# Patient Record
Sex: Female | Born: 1940
Health system: Southern US, Community
[De-identification: ages and names within clinical notes are randomized; demographics above are authoritative.]

## PROBLEM LIST (undated history)

## (undated) DIAGNOSIS — I10 Essential (primary) hypertension: Secondary | ICD-10-CM

## (undated) HISTORY — PX: APPENDECTOMY: SHX54

## (undated) HISTORY — PX: ABDOMINAL HYSTERECTOMY: SHX81

## (undated) HISTORY — DX: Essential (primary) hypertension: I10

---

## 2001-06-12 ENCOUNTER — Ambulatory Visit (HOSPITAL_COMMUNITY): Admission: RE | Admit: 2001-06-12 | Discharge: 2001-06-12 | Payer: Self-pay | Admitting: Obstetrics and Gynecology

## 2001-06-12 ENCOUNTER — Encounter: Payer: Self-pay | Admitting: Obstetrics and Gynecology

## 2001-06-21 ENCOUNTER — Other Ambulatory Visit: Admission: RE | Admit: 2001-06-21 | Discharge: 2001-06-21 | Payer: Self-pay | Admitting: Dermatology

## 2002-04-30 ENCOUNTER — Encounter: Payer: Self-pay | Admitting: Obstetrics and Gynecology

## 2002-04-30 ENCOUNTER — Ambulatory Visit (HOSPITAL_COMMUNITY): Admission: RE | Admit: 2002-04-30 | Discharge: 2002-04-30 | Payer: Self-pay | Admitting: Obstetrics and Gynecology

## 2004-05-31 ENCOUNTER — Ambulatory Visit (HOSPITAL_COMMUNITY): Admission: RE | Admit: 2004-05-31 | Discharge: 2004-05-31 | Payer: Self-pay | Admitting: Obstetrics and Gynecology

## 2005-09-06 ENCOUNTER — Ambulatory Visit (HOSPITAL_COMMUNITY): Admission: RE | Admit: 2005-09-06 | Discharge: 2005-09-06 | Payer: Self-pay | Admitting: Obstetrics and Gynecology

## 2006-05-31 ENCOUNTER — Ambulatory Visit: Payer: Self-pay | Admitting: Internal Medicine

## 2006-06-08 ENCOUNTER — Ambulatory Visit: Payer: Self-pay | Admitting: Internal Medicine

## 2006-06-08 ENCOUNTER — Ambulatory Visit (HOSPITAL_COMMUNITY): Admission: RE | Admit: 2006-06-08 | Discharge: 2006-06-08 | Payer: Self-pay | Admitting: Internal Medicine

## 2006-12-18 ENCOUNTER — Ambulatory Visit: Payer: Self-pay | Admitting: Family Medicine

## 2006-12-18 DIAGNOSIS — M129 Arthropathy, unspecified: Secondary | ICD-10-CM | POA: Insufficient documentation

## 2006-12-18 DIAGNOSIS — K648 Other hemorrhoids: Secondary | ICD-10-CM | POA: Insufficient documentation

## 2006-12-19 ENCOUNTER — Encounter (INDEPENDENT_AMBULATORY_CARE_PROVIDER_SITE_OTHER): Payer: Self-pay | Admitting: Family Medicine

## 2006-12-20 ENCOUNTER — Telehealth (INDEPENDENT_AMBULATORY_CARE_PROVIDER_SITE_OTHER): Payer: Self-pay | Admitting: *Deleted

## 2006-12-20 LAB — CONVERTED CEMR LAB
Albumin: 4.3 g/dL (ref 3.5–5.2)
Alkaline Phosphatase: 75 units/L (ref 39–117)
BUN: 18 mg/dL (ref 6–23)
CO2: 24 meq/L (ref 19–32)
Cholesterol: 251 mg/dL — ABNORMAL HIGH (ref 0–200)
Eosinophils Absolute: 0.1 10*3/uL (ref 0.0–0.7)
Eosinophils Relative: 1 % (ref 0–5)
Glucose, Bld: 93 mg/dL (ref 70–99)
HCT: 42 % (ref 36.0–46.0)
HDL: 59 mg/dL (ref >39)
Hemoglobin: 13.2 g/dL (ref 12.0–15.0)
LDL Cholesterol: 167 mg/dL — ABNORMAL HIGH (ref 0–99)
Lymphocytes Relative: 40 % (ref 12–46)
Lymphs Abs: 3.2 10*3/uL (ref 0.7–3.3)
MCV: 98.1 fL (ref 78.0–100.0)
Monocytes Relative: 7 % (ref 3–11)
RBC: 4.28 M/uL (ref 3.87–5.11)
Sodium: 143 meq/L (ref 135–145)
Total Bilirubin: 0.4 mg/dL (ref 0.3–1.2)
Total Protein: 7 g/dL (ref 6.0–8.3)
Triglycerides: 127 mg/dL (ref <150)
VLDL: 25 mg/dL (ref 0–40)
WBC: 8.1 10*3/uL (ref 4.0–10.5)

## 2006-12-26 ENCOUNTER — Encounter (INDEPENDENT_AMBULATORY_CARE_PROVIDER_SITE_OTHER): Payer: Self-pay | Admitting: Family Medicine

## 2006-12-27 ENCOUNTER — Encounter (INDEPENDENT_AMBULATORY_CARE_PROVIDER_SITE_OTHER): Payer: Self-pay | Admitting: Family Medicine

## 2007-01-29 ENCOUNTER — Telehealth (INDEPENDENT_AMBULATORY_CARE_PROVIDER_SITE_OTHER): Payer: Self-pay | Admitting: *Deleted

## 2007-01-29 ENCOUNTER — Ambulatory Visit: Payer: Self-pay | Admitting: Family Medicine

## 2007-01-29 DIAGNOSIS — E785 Hyperlipidemia, unspecified: Secondary | ICD-10-CM | POA: Insufficient documentation

## 2007-01-29 LAB — CONVERTED CEMR LAB
HDL goal, serum: 40 mg/dL
LDL Goal: 160 mg/dL

## 2007-01-30 ENCOUNTER — Encounter (INDEPENDENT_AMBULATORY_CARE_PROVIDER_SITE_OTHER): Payer: Self-pay | Admitting: Family Medicine

## 2007-02-22 ENCOUNTER — Ambulatory Visit (HOSPITAL_COMMUNITY): Admission: RE | Admit: 2007-02-22 | Discharge: 2007-02-22 | Payer: Self-pay | Admitting: Family Medicine

## 2007-05-17 ENCOUNTER — Encounter (INDEPENDENT_AMBULATORY_CARE_PROVIDER_SITE_OTHER): Payer: Self-pay | Admitting: Family Medicine

## 2007-05-22 ENCOUNTER — Telehealth (INDEPENDENT_AMBULATORY_CARE_PROVIDER_SITE_OTHER): Payer: Self-pay | Admitting: *Deleted

## 2007-05-23 ENCOUNTER — Ambulatory Visit: Payer: Self-pay | Admitting: Family Medicine

## 2007-05-23 LAB — CONVERTED CEMR LAB: LDL Goal: 130 mg/dL

## 2007-05-24 ENCOUNTER — Telehealth (INDEPENDENT_AMBULATORY_CARE_PROVIDER_SITE_OTHER): Payer: Self-pay | Admitting: *Deleted

## 2007-05-25 ENCOUNTER — Encounter (INDEPENDENT_AMBULATORY_CARE_PROVIDER_SITE_OTHER): Payer: Self-pay | Admitting: Family Medicine

## 2007-05-25 LAB — CONVERTED CEMR LAB
ALT: 14 units/L (ref 0–35)
AST: 15 units/L (ref 0–37)
Albumin: 4.3 g/dL (ref 3.5–5.2)
Alkaline Phosphatase: 81 units/L (ref 39–117)
LDL Cholesterol: 141 mg/dL — ABNORMAL HIGH (ref 0–99)
Potassium: 4.2 meq/L (ref 3.5–5.3)
Sodium: 142 meq/L (ref 135–145)
Total Bilirubin: 0.4 mg/dL (ref 0.3–1.2)
Total Protein: 7.1 g/dL (ref 6.0–8.3)
VLDL: 14 mg/dL (ref 0–40)

## 2007-05-29 ENCOUNTER — Telehealth (INDEPENDENT_AMBULATORY_CARE_PROVIDER_SITE_OTHER): Payer: Self-pay | Admitting: Family Medicine

## 2007-05-31 ENCOUNTER — Ambulatory Visit (HOSPITAL_COMMUNITY): Admission: RE | Admit: 2007-05-31 | Discharge: 2007-05-31 | Payer: Self-pay | Admitting: Family Medicine

## 2007-05-31 ENCOUNTER — Encounter (INDEPENDENT_AMBULATORY_CARE_PROVIDER_SITE_OTHER): Payer: Self-pay | Admitting: Family Medicine

## 2007-06-01 ENCOUNTER — Telehealth (INDEPENDENT_AMBULATORY_CARE_PROVIDER_SITE_OTHER): Payer: Self-pay | Admitting: *Deleted

## 2007-06-05 ENCOUNTER — Encounter (INDEPENDENT_AMBULATORY_CARE_PROVIDER_SITE_OTHER): Payer: Self-pay | Admitting: Family Medicine

## 2007-06-22 ENCOUNTER — Ambulatory Visit: Payer: Self-pay | Admitting: Family Medicine

## 2007-06-22 DIAGNOSIS — M810 Age-related osteoporosis without current pathological fracture: Secondary | ICD-10-CM | POA: Insufficient documentation

## 2007-06-22 LAB — CONVERTED CEMR LAB: LDL Goal: 160 mg/dL

## 2007-07-16 LAB — CONVERTED CEMR LAB: Pap Smear: NORMAL

## 2007-07-20 ENCOUNTER — Ambulatory Visit: Payer: Self-pay | Admitting: Family Medicine

## 2007-08-14 ENCOUNTER — Ambulatory Visit: Payer: Self-pay | Admitting: Family Medicine

## 2007-11-06 ENCOUNTER — Telehealth (INDEPENDENT_AMBULATORY_CARE_PROVIDER_SITE_OTHER): Payer: Self-pay | Admitting: Family Medicine

## 2008-05-08 ENCOUNTER — Ambulatory Visit: Payer: Self-pay | Admitting: Family Medicine

## 2008-05-10 ENCOUNTER — Encounter (INDEPENDENT_AMBULATORY_CARE_PROVIDER_SITE_OTHER): Payer: Self-pay | Admitting: Family Medicine

## 2008-05-12 LAB — CONVERTED CEMR LAB
ALT: 13 units/L (ref 0–35)
AST: 15 units/L (ref 0–37)
Calcium: 9.1 mg/dL (ref 8.4–10.5)
Chloride: 106 meq/L (ref 96–112)
Creatinine, Ser: 0.81 mg/dL (ref 0.40–1.20)
Eosinophils Absolute: 0.1 10*3/uL (ref 0.0–0.7)
Lymphocytes Relative: 46 % (ref 12–46)
Lymphs Abs: 3 10*3/uL (ref 0.7–4.0)
MCV: 95.3 fL (ref 78.0–100.0)
Neutro Abs: 2.8 10*3/uL (ref 1.7–7.7)
Neutrophils Relative %: 43 % (ref 43–77)
Platelets: 282 10*3/uL (ref 150–400)
RBC: 4.06 M/uL (ref 3.87–5.11)
Sodium: 142 meq/L (ref 135–145)
TSH: 1.332 microintl units/mL (ref 0.350–4.500)
Total CHOL/HDL Ratio: 3.7
Total Protein: 6.9 g/dL (ref 6.0–8.3)
VLDL: 16 mg/dL (ref 0–40)
WBC: 6.4 10*3/uL (ref 4.0–10.5)

## 2008-06-16 ENCOUNTER — Telehealth (INDEPENDENT_AMBULATORY_CARE_PROVIDER_SITE_OTHER): Payer: Self-pay | Admitting: *Deleted

## 2008-10-16 ENCOUNTER — Ambulatory Visit: Payer: Self-pay | Admitting: Family Medicine

## 2008-10-16 DIAGNOSIS — R7309 Other abnormal glucose: Secondary | ICD-10-CM | POA: Insufficient documentation

## 2008-11-05 ENCOUNTER — Encounter (INDEPENDENT_AMBULATORY_CARE_PROVIDER_SITE_OTHER): Payer: Self-pay | Admitting: Family Medicine

## 2009-06-23 ENCOUNTER — Emergency Department (HOSPITAL_COMMUNITY): Admission: EM | Admit: 2009-06-23 | Discharge: 2009-06-23 | Payer: Self-pay | Admitting: Emergency Medicine

## 2009-07-23 ENCOUNTER — Encounter: Admission: RE | Admit: 2009-07-23 | Discharge: 2009-07-23 | Payer: Self-pay | Admitting: Internal Medicine

## 2010-02-25 ENCOUNTER — Ambulatory Visit
Admission: RE | Admit: 2010-02-25 | Discharge: 2010-02-25 | Payer: Self-pay | Source: Home / Self Care | Attending: Internal Medicine | Admitting: Internal Medicine

## 2010-03-07 ENCOUNTER — Encounter: Payer: Self-pay | Admitting: Family Medicine

## 2010-05-04 LAB — GLUCOSE, CAPILLARY: Glucose-Capillary: 103 mg/dL — ABNORMAL HIGH (ref 70–99)

## 2010-07-02 NOTE — Op Note (Signed)
NAME:  Jillian Koch, Jillian Koch              ACCOUNT NO.:  [id number]   MEDICAL RECORD NO.:  [id number]          PATIENT TYPE:  AMB   LOCATION:  DAY                           FACILITY:  [facility]   PHYSICIAN:  R. Roetta Sessions, M.D. DATE OF BIRTH:  11-21-1940   DATE OF PROCEDURE:  06/08/2006  DATE OF DISCHARGE:                               OPERATIVE REPORT   PROCEDURE:  Diagnostic colonoscopy.   INDICATIONS FOR PROCEDURE:  The patient is 70 year old lady with  intermittent hematochezia.  She has multiple second degree relatives  with colorectal cancer.  She has never had her lower GI tract evaluated.  Colonoscopy is now being done.  This approach has been discussed with  the patient at length.  The potential risks, benefits and alternatives  have been reviewed, questions answered.  Please see documentation in the  medical record.   PROCEDURE NOTE:  O2 saturation, blood pressure, and pulse oximetry were  monitored the entire procedure.  Conscious sedation Versed 6 mg IV and  Demerol 75 mg IV in divided doses.  Instrument Olympus video chip  system.   FINDINGS:  Digital rectal exam revealed no abnormalities.  Endoscopic findings revealed the prep was adequate.  Examination of the  colonic mucosa was undertaken.  The scope was advanced from the  rectosigmoid junction through the left transverse, right colon, to the  appendiceal orifice, ileocecal valve, and cecum.  These structures were  well seen and photographed for the record.  From this level, the scope  was slowly withdrawn.  All previous mucosal surfaces were again seen.  The patient had numerous left sided diverticula.  There were a couple of  small petechiae around a couple of the diverticula openings.  However,  the remainder of the colonic mucosa appeared entirely normal.  The scope  was pulled down in the rectum where a thorough examination of the rectal  mucosa including retroflex view of the anal verge demonstrated multiple  anal  papilla and internal hemorrhoids only.  The patient tolerated the  procedure well and was reacted in endoscopy.   IMPRESSION:  Multiple anal papilla and internal hemorrhoids, otherwise,  normal rectum. Extensive left sided diverticula with some petechiae  around a couple of the openings of a few tics.  Otherwise, the colonic  mucosa appeared normal.  I suspect the patient bled from hemorrhoids.   RECOMMENDATIONS:  1. Hemorrhoid and high fiber diet/diverticulosis literature provided      to [name]. Daily Benifiber 1 tablespoon daily.  2. Ten day course of Anusol HC suppositories, one per rectum at      bedtime.  3. She is to let me know if bleeding persists.  4. Given her family history, would recommend repeat colonoscopy in      five years for screening purposes.      Jonathon Bellows, M.D.  Electronically Signed     RMR/MEDQ  D:  06/08/2006  T:  06/08/2006  Job:  [id number]

## 2010-07-02 NOTE — H&P (Signed)
NAME:  Jillian Koch, Jillian Koch              ACCOUNT NO.:  [id number]   MEDICAL RECORD NO.:  [id number]           PATIENT TYPE:  AMB   LOCATION:                                FACILITY:  [facility]   PHYSICIAN:  R. Roetta Sessions, M.D. DATE OF BIRTH:  [birth year]   DATE OF ADMISSION:  DATE OF DISCHARGE:  [facility]                              HISTORY & PHYSICAL   CHIEF COMPLAINT:  Intermittent hematochezia, need colonoscopy.   HISTORY OF PRESENT ILLNESS:  [name] is a very pleasant 70-  year-old Caucasian female who came to see me on her own to further  discuss intermittent rectal bleeding, need for colonoscopy.  [name]  has noted intermittently of the past several months some red blood per  rectum with having otherwise normal bowel movements.  She has 1 to 2  bowel movements daily.  Denies constipation, diarrhea, sometimes has  some vague generalized abdominal pain with BMs.  Has not had any melena,  has not had any change in weight.  No upper GI tract symptoms such as  odynophagia, dysphagia or reflux symptoms, nausea, vomiting.   FAMILY HISTORY:  Significant in that 3 aunts on her mother's side  succumbed to colorectal cancer and she recalls they were diagnosed in  their eighth decade of life.  [name] has never had her lower GI  tract evaluated.   PAST MEDICAL HISTORY:  Unremarkable for chronic illnesses.   PAST SURGERIES:  Hysterectomy, appendectomy.  She takes vitamin  supplement daily.   ALLERGIES:  NO KNOWN DRUG ALLERGIES.   FAMILY HISTORY:  Mother died age 53 with CVA.  Father died age 84  related to a tractor accident.  She has five sisters and four brothers  all in apparent good health.  No history chronic GI or liver illness  other than as stated above.   SOCIAL HISTORY:  The patient is married, has one son adopted.  She is  self-employed in the cleaning business, no tobacco, no alcohol.   REVIEW OF SYSTEMS:  No chest pain, dyspnea on exertion.  No change in  weight.   No fever, chills.   PHYSICAL EXAMINATION:  This is a 70 year old lady resting comfortably.  Weight 146, height 5 feet 02, temperature 97.8,  BP 140/90, pulse 72.  SKIN:  Warm and dry.  There is no jaundice or a stigmata of chronic  liver disease.  HEENT:  No scleral icterus.  JVD is not prominent.  CHEST:  Lungs are clear to auscultation.  CARDIAC:  Regular rate and rhythm without murmur, gallop, rub.  BREAST EXAM:  Deferred..  ABDOMEN:  Nondistended.  Positive bowel sounds, soft, nontender without  appreciable mass or organomegaly.  EXTREMITIES: No edema.  RECTAL EXAM:  deferred to time of colonoscopy.   IMPRESSION:  [name] is a  pleasant 70 year old lady with  intermittent hematochezia.  She has never had her lower GI tract  evaluated.  There is positive family history of colon cancer in multiple  second-degree relatives.  This lady needs to have a colonoscopy and I  have discussed this approach  with [name].  Potential risks, benefits  and alternatives have been reviewed, questions answered.  She is  agreeable.   PLAN:  Perform diagnostic colonoscopy in the very near future.  Will  make further recommendations at that time.      Jonathon Bellows, M.D.  Electronically Signed     RMR/MEDQ  D:  05/31/2006  T:  05/31/2006  Job:  [id number]

## 2010-09-02 ENCOUNTER — Other Ambulatory Visit: Payer: Self-pay | Admitting: Internal Medicine

## 2010-09-02 DIAGNOSIS — Z1231 Encounter for screening mammogram for malignant neoplasm of breast: Secondary | ICD-10-CM

## 2010-09-10 ENCOUNTER — Ambulatory Visit
Admission: RE | Admit: 2010-09-10 | Discharge: 2010-09-10 | Disposition: A | Discharge status: Home / Self Care | Payer: Medicare Other | Source: Ambulatory Visit | Attending: Internal Medicine | Admitting: Internal Medicine

## 2010-09-10 DIAGNOSIS — Z1231 Encounter for screening mammogram for malignant neoplasm of breast: Secondary | ICD-10-CM

## 2011-05-10 ENCOUNTER — Encounter: Payer: Self-pay | Admitting: Internal Medicine

## 2011-07-27 ENCOUNTER — Other Ambulatory Visit: Payer: Self-pay | Admitting: Internal Medicine

## 2011-07-27 DIAGNOSIS — Z1231 Encounter for screening mammogram for malignant neoplasm of breast: Secondary | ICD-10-CM

## 2011-09-16 ENCOUNTER — Ambulatory Visit: Payer: Medicare Other

## 2011-10-07 ENCOUNTER — Ambulatory Visit
Admission: RE | Admit: 2011-10-07 | Discharge: 2011-10-07 | Disposition: A | Discharge status: Home / Self Care | Payer: Medicare Other | Source: Ambulatory Visit | Attending: Internal Medicine | Admitting: Internal Medicine

## 2011-10-07 DIAGNOSIS — Z1231 Encounter for screening mammogram for malignant neoplasm of breast: Secondary | ICD-10-CM

## 2011-12-29 ENCOUNTER — Encounter: Payer: Self-pay | Admitting: Internal Medicine

## 2012-02-10 ENCOUNTER — Ambulatory Visit (AMBULATORY_SURGERY_CENTER): Payer: Medicare Other

## 2012-02-10 VITALS — Ht 62.0 in | Wt 145.0 lb

## 2012-02-10 DIAGNOSIS — Z1211 Encounter for screening for malignant neoplasm of colon: Secondary | ICD-10-CM

## 2012-02-10 MED ORDER — MOVIPREP 100 G PO SOLR: 1.0000 | Freq: Once | ORAL | Status: DC

## 2012-02-24 ENCOUNTER — Ambulatory Visit (AMBULATORY_SURGERY_CENTER): Payer: Medicare Other | Admitting: Internal Medicine

## 2012-02-24 ENCOUNTER — Encounter: Payer: Self-pay | Admitting: Internal Medicine

## 2012-02-24 VITALS — BP 143/76 | HR 68 | Temp 96.0°F | Resp 22 | Ht 62.0 in | Wt 145.0 lb

## 2012-02-24 DIAGNOSIS — Z1211 Encounter for screening for malignant neoplasm of colon: Secondary | ICD-10-CM

## 2012-02-24 MED ORDER — SODIUM CHLORIDE 0.9 % IV SOLN: 500.0000 mL | INTRAVENOUS | Status: DC

## 2012-02-24 NOTE — Progress Notes (Signed)
1610 a/ox3 pleased report to April RN

## 2012-02-24 NOTE — Patient Instructions (Addendum)

## 2012-02-24 NOTE — Progress Notes (Signed)
Patient did not experience any of the following events: a burn prior to discharge; a fall within the facility; wrong site/side/patient/procedure/implant event; or a hospital transfer or hospital admission upon discharge from the facility. (G8907) Patient did not have preoperative order for IV antibiotic SSI prophylaxis. (G8918)  

## 2012-02-24 NOTE — Op Note (Signed)
[facility] [address] [place], [address]   COLONOSCOPY PROCEDURE REPORT  PATIENT: Jillian Koch, Jillian Koch  MR#: [id number] BIRTHDATE: [birth year] , 71  yrs. old GENDER: Female ENDOSCOPIST: Hart Carwin, MD REFERRED BY:  Pearson Grippe, M.D. PROCEDURE DATE:  02/24/2012 PROCEDURE:   Colonoscopy, screening ASA CLASS:   Class II INDICATIONS:Average risk patient for colon cancer and positive family history of colon cancer in several indirect relatives, last colon 2008 by Dr Charlies Constable showed hems and diverticuli. MEDICATIONS: MAC sedation, administered by CRNA and propofol (Diprivan) 150mg  IV  DESCRIPTION OF PROCEDURE:   After the risks and benefits and of the procedure were explained, informed consent was obtained.  A digital rectal exam revealed no abnormalities of the rectum.    The LB [id number]-[id number]  endoscope was introduced through the anus and advanced to the cecum, which was identified by both the appendix and ileocecal valve .  The quality of the prep was good, using MoviPrep .  The instrument was then slowly withdrawn as the colon was fully examined.     COLON FINDINGS: There was moderate diverticulosis noted in the sigmoid colon with associated tortuosity and colonic narrowing. Retroflexed views revealed no abnormalities.     The scope was then withdrawn from the patient and the procedure completed.  COMPLICATIONS: There were no complications. ENDOSCOPIC IMPRESSION: There was moderate diverticulosis noted in the sigmoid colon  RECOMMENDATIONS: High fiber diet   REPEAT EXAM: In 7 year(s)  for Colonoscopy.  cc:  _______________________________ eSignedHart Carwin, MD 02/24/2012 9:42 AM

## 2012-02-27 ENCOUNTER — Telehealth: Payer: Self-pay | Admitting: *Deleted

## 2012-02-27 NOTE — Telephone Encounter (Signed)
  Follow up Call-  Call back number 02/24/2012  Post procedure Call Back phone  # [phone]  Permission to leave phone message Yes     Patient questions:  Do you have a fever, pain , or abdominal swelling? no Pain Score  0 *  Have you tolerated food without any problems? yes  Have you been able to return to your normal activities? yes  Do you have any questions about your discharge instructions: Diet   no Medications  no Follow up visit  no  Do you have questions or concerns about your Care? no  Actions: * If pain score is 4 or above: No action needed, pain <4.

## 2012-07-02 ENCOUNTER — Other Ambulatory Visit: Payer: Self-pay | Admitting: Internal Medicine

## 2012-07-02 DIAGNOSIS — N63 Unspecified lump in unspecified breast: Secondary | ICD-10-CM

## 2012-07-13 ENCOUNTER — Ambulatory Visit
Admission: RE | Admit: 2012-07-13 | Discharge: 2012-07-13 | Disposition: A | Discharge status: Home / Self Care | Payer: Medicare Other | Source: Ambulatory Visit | Attending: Internal Medicine | Admitting: Internal Medicine

## 2012-07-13 DIAGNOSIS — N63 Unspecified lump in unspecified breast: Secondary | ICD-10-CM

## 2012-08-27 ENCOUNTER — Other Ambulatory Visit: Payer: Self-pay

## 2012-08-27 DIAGNOSIS — Z1231 Encounter for screening mammogram for malignant neoplasm of breast: Secondary | ICD-10-CM

## 2012-10-19 ENCOUNTER — Ambulatory Visit
Admission: RE | Admit: 2012-10-19 | Discharge: 2012-10-19 | Disposition: A | Discharge status: Home / Self Care | Payer: Medicare Other | Source: Ambulatory Visit

## 2012-10-19 DIAGNOSIS — Z1231 Encounter for screening mammogram for malignant neoplasm of breast: Secondary | ICD-10-CM

## 2013-09-17 ENCOUNTER — Other Ambulatory Visit: Payer: Self-pay

## 2013-09-17 DIAGNOSIS — Z1231 Encounter for screening mammogram for malignant neoplasm of breast: Secondary | ICD-10-CM

## 2013-10-25 ENCOUNTER — Ambulatory Visit
Admission: RE | Admit: 2013-10-25 | Discharge: 2013-10-25 | Disposition: A | Discharge status: Home / Self Care | Payer: Medicare HMO | Source: Ambulatory Visit

## 2013-10-25 ENCOUNTER — Encounter (INDEPENDENT_AMBULATORY_CARE_PROVIDER_SITE_OTHER): Payer: Self-pay

## 2013-10-25 DIAGNOSIS — Z1231 Encounter for screening mammogram for malignant neoplasm of breast: Secondary | ICD-10-CM

## 2014-02-28 ENCOUNTER — Ambulatory Visit: Payer: Medicare HMO | Admitting: Urology

## 2014-09-23 ENCOUNTER — Other Ambulatory Visit: Payer: Self-pay

## 2014-09-23 DIAGNOSIS — Z1231 Encounter for screening mammogram for malignant neoplasm of breast: Secondary | ICD-10-CM

## 2014-10-31 ENCOUNTER — Ambulatory Visit
Admission: RE | Admit: 2014-10-31 | Discharge: 2014-10-31 | Disposition: A | Discharge status: Home / Self Care | Payer: Medicare HMO | Source: Ambulatory Visit

## 2014-10-31 DIAGNOSIS — Z1231 Encounter for screening mammogram for malignant neoplasm of breast: Secondary | ICD-10-CM

## 2014-12-05 DIAGNOSIS — D2312 Other benign neoplasm of skin of left eyelid, including canthus: Secondary | ICD-10-CM | POA: Diagnosis not present

## 2014-12-05 DIAGNOSIS — H524 Presbyopia: Secondary | ICD-10-CM | POA: Diagnosis not present

## 2014-12-05 DIAGNOSIS — H43813 Vitreous degeneration, bilateral: Secondary | ICD-10-CM | POA: Diagnosis not present

## 2014-12-05 DIAGNOSIS — H21233 Degeneration of iris (pigmentary), bilateral: Secondary | ICD-10-CM | POA: Diagnosis not present

## 2015-03-20 DIAGNOSIS — I10 Essential (primary) hypertension: Secondary | ICD-10-CM | POA: Diagnosis not present

## 2015-03-20 DIAGNOSIS — E559 Vitamin D deficiency, unspecified: Secondary | ICD-10-CM | POA: Diagnosis not present

## 2015-03-27 DIAGNOSIS — E7800 Pure hypercholesterolemia, unspecified: Secondary | ICD-10-CM | POA: Diagnosis not present

## 2015-03-27 DIAGNOSIS — I10 Essential (primary) hypertension: Secondary | ICD-10-CM | POA: Diagnosis not present

## 2015-03-27 DIAGNOSIS — M859 Disorder of bone density and structure, unspecified: Secondary | ICD-10-CM | POA: Diagnosis not present

## 2015-06-30 DIAGNOSIS — R69 Illness, unspecified: Secondary | ICD-10-CM | POA: Diagnosis not present

## 2015-08-11 DIAGNOSIS — L821 Other seborrheic keratosis: Secondary | ICD-10-CM | POA: Diagnosis not present

## 2015-08-11 DIAGNOSIS — L570 Actinic keratosis: Secondary | ICD-10-CM | POA: Diagnosis not present

## 2015-09-15 DIAGNOSIS — I10 Essential (primary) hypertension: Secondary | ICD-10-CM | POA: Diagnosis not present

## 2015-09-15 DIAGNOSIS — M859 Disorder of bone density and structure, unspecified: Secondary | ICD-10-CM | POA: Diagnosis not present

## 2015-09-15 DIAGNOSIS — E7800 Pure hypercholesterolemia, unspecified: Secondary | ICD-10-CM | POA: Diagnosis not present

## 2015-09-15 DIAGNOSIS — R109 Unspecified abdominal pain: Secondary | ICD-10-CM | POA: Diagnosis not present

## 2015-09-25 DIAGNOSIS — I10 Essential (primary) hypertension: Secondary | ICD-10-CM | POA: Diagnosis not present

## 2015-09-25 DIAGNOSIS — E7800 Pure hypercholesterolemia, unspecified: Secondary | ICD-10-CM | POA: Diagnosis not present

## 2015-10-02 DIAGNOSIS — I10 Essential (primary) hypertension: Secondary | ICD-10-CM | POA: Diagnosis not present

## 2015-10-02 DIAGNOSIS — E7800 Pure hypercholesterolemia, unspecified: Secondary | ICD-10-CM | POA: Diagnosis not present

## 2015-10-02 DIAGNOSIS — M8580 Other specified disorders of bone density and structure, unspecified site: Secondary | ICD-10-CM | POA: Diagnosis not present

## 2015-10-02 DIAGNOSIS — Z0000 Encounter for general adult medical examination without abnormal findings: Secondary | ICD-10-CM | POA: Diagnosis not present

## 2015-10-07 ENCOUNTER — Other Ambulatory Visit: Payer: Self-pay | Admitting: Internal Medicine

## 2015-10-07 DIAGNOSIS — Z1231 Encounter for screening mammogram for malignant neoplasm of breast: Secondary | ICD-10-CM

## 2015-11-06 ENCOUNTER — Ambulatory Visit
Admission: RE | Admit: 2015-11-06 | Discharge: 2015-11-06 | Disposition: A | Discharge status: Home / Self Care | Payer: Medicare HMO | Source: Ambulatory Visit | Attending: Internal Medicine | Admitting: Internal Medicine

## 2015-11-06 DIAGNOSIS — Z1231 Encounter for screening mammogram for malignant neoplasm of breast: Secondary | ICD-10-CM | POA: Diagnosis not present

## 2016-01-15 DIAGNOSIS — H21233 Degeneration of iris (pigmentary), bilateral: Secondary | ICD-10-CM | POA: Diagnosis not present

## 2016-01-15 DIAGNOSIS — H524 Presbyopia: Secondary | ICD-10-CM | POA: Diagnosis not present

## 2016-01-15 DIAGNOSIS — H04123 Dry eye syndrome of bilateral lacrimal glands: Secondary | ICD-10-CM | POA: Diagnosis not present

## 2016-01-15 DIAGNOSIS — H2513 Age-related nuclear cataract, bilateral: Secondary | ICD-10-CM | POA: Diagnosis not present

## 2016-03-17 DIAGNOSIS — T1511XA Foreign body in conjunctival sac, right eye, initial encounter: Secondary | ICD-10-CM | POA: Diagnosis not present

## 2016-03-25 DIAGNOSIS — E559 Vitamin D deficiency, unspecified: Secondary | ICD-10-CM | POA: Diagnosis not present

## 2016-03-25 DIAGNOSIS — M8580 Other specified disorders of bone density and structure, unspecified site: Secondary | ICD-10-CM | POA: Diagnosis not present

## 2016-03-25 DIAGNOSIS — E7800 Pure hypercholesterolemia, unspecified: Secondary | ICD-10-CM | POA: Diagnosis not present

## 2016-03-25 DIAGNOSIS — I10 Essential (primary) hypertension: Secondary | ICD-10-CM | POA: Diagnosis not present

## 2016-04-01 DIAGNOSIS — E7800 Pure hypercholesterolemia, unspecified: Secondary | ICD-10-CM | POA: Diagnosis not present

## 2016-04-01 DIAGNOSIS — I10 Essential (primary) hypertension: Secondary | ICD-10-CM | POA: Diagnosis not present

## 2016-04-01 DIAGNOSIS — Z0000 Encounter for general adult medical examination without abnormal findings: Secondary | ICD-10-CM | POA: Diagnosis not present

## 2016-07-16 DIAGNOSIS — R233 Spontaneous ecchymoses: Secondary | ICD-10-CM | POA: Diagnosis not present

## 2016-07-16 DIAGNOSIS — Z6826 Body mass index (BMI) 26.0-26.9, adult: Secondary | ICD-10-CM | POA: Diagnosis not present

## 2016-07-16 DIAGNOSIS — Z79899 Other long term (current) drug therapy: Secondary | ICD-10-CM | POA: Diagnosis not present

## 2016-07-16 DIAGNOSIS — I10 Essential (primary) hypertension: Secondary | ICD-10-CM | POA: Diagnosis not present

## 2016-07-16 DIAGNOSIS — Z98811 Dental restoration status: Secondary | ICD-10-CM | POA: Diagnosis not present

## 2016-07-16 DIAGNOSIS — E663 Overweight: Secondary | ICD-10-CM | POA: Diagnosis not present

## 2016-07-16 DIAGNOSIS — Z0000 Encounter for general adult medical examination without abnormal findings: Secondary | ICD-10-CM | POA: Diagnosis not present

## 2016-07-16 DIAGNOSIS — K08409 Partial loss of teeth, unspecified cause, unspecified class: Secondary | ICD-10-CM | POA: Diagnosis not present

## 2016-07-16 DIAGNOSIS — E782 Mixed hyperlipidemia: Secondary | ICD-10-CM | POA: Diagnosis not present

## 2016-07-22 DIAGNOSIS — H21233 Degeneration of iris (pigmentary), bilateral: Secondary | ICD-10-CM | POA: Diagnosis not present

## 2016-07-22 DIAGNOSIS — H40013 Open angle with borderline findings, low risk, bilateral: Secondary | ICD-10-CM | POA: Diagnosis not present

## 2016-09-01 DIAGNOSIS — D1800 Hemangioma unspecified site: Secondary | ICD-10-CM | POA: Diagnosis not present

## 2016-09-01 DIAGNOSIS — L570 Actinic keratosis: Secondary | ICD-10-CM | POA: Diagnosis not present

## 2016-09-01 DIAGNOSIS — L821 Other seborrheic keratosis: Secondary | ICD-10-CM | POA: Diagnosis not present

## 2016-09-29 ENCOUNTER — Ambulatory Visit (INDEPENDENT_AMBULATORY_CARE_PROVIDER_SITE_OTHER): Payer: Medicare HMO | Admitting: Physician Assistant

## 2016-09-29 VITALS — BP 170/76 | HR 58 | Temp 97.6°F | Resp 16 | Ht 59.5 in | Wt 145.6 lb

## 2016-09-29 DIAGNOSIS — Z0000 Encounter for general adult medical examination without abnormal findings: Secondary | ICD-10-CM | POA: Diagnosis not present

## 2016-09-29 DIAGNOSIS — M810 Age-related osteoporosis without current pathological fracture: Secondary | ICD-10-CM

## 2016-09-29 DIAGNOSIS — E785 Hyperlipidemia, unspecified: Secondary | ICD-10-CM | POA: Diagnosis not present

## 2016-09-29 DIAGNOSIS — I10 Essential (primary) hypertension: Secondary | ICD-10-CM | POA: Diagnosis not present

## 2016-09-29 LAB — CBC WITH DIFFERENTIAL/PLATELET
Basophils Absolute: 67 cells/uL (ref 0–200)
Basophils Relative: 1 %
Eosinophils Absolute: 134 cells/uL (ref 15–500)
Eosinophils Relative: 2 %
HEMATOCRIT: 39.2 % (ref 35.0–45.0)
Hemoglobin: 13.2 g/dL (ref 12.0–15.0)
LYMPHS PCT: 42 %
Lymphs Abs: 2814 cells/uL (ref 850–3900)
MCH: 31.9 pg (ref 27.0–33.0)
MCHC: 33.7 g/dL (ref 32.0–36.0)
MCV: 94.7 fL (ref 80.0–100.0)
MPV: 8.9 fL (ref 7.5–12.5)
Monocytes Absolute: 402 cells/uL (ref 200–950)
Monocytes Relative: 6 %
Neutro Abs: 3283 cells/uL (ref 1500–7800)
Neutrophils Relative %: 49 %
Platelets: 271 10*3/uL (ref 140–400)
RBC: 4.14 MIL/uL (ref 3.80–5.10)
RDW: 13.9 % (ref 11.0–15.0)
WBC: 6.7 10*3/uL (ref 3.8–10.8)

## 2016-09-29 LAB — TSH: TSH: 1.14 mIU/L

## 2016-09-29 MED ORDER — AMLODIPINE BESYLATE 5 MG PO TABS: 5.0000 mg | ORAL_TABLET | Freq: Every day | ORAL | 0 refills | Status: DC

## 2016-09-29 NOTE — Progress Notes (Signed)
Patient ID: Jillian Koch MRN: [id number], DOB: 17-Dec-1940, 76 y.o. Date of Encounter: 09/29/2016,   Chief Complaint: Physical (CPE)  HPI: 76 y.o. y/o female  here for CPE.   She reports that she has been seeing Dr. Maudie Mercury in [place]. At [facility]--located on [address]. She reports that she has 2 sisters and her brother-in-law who come here to this office. States that this location is better for her.  She reports that her last routine visit with the prior PCP was February 2018 and that her next visit there was scheduled for next Friday but she is canceling that.  States that that appointment for next week was for a complete physical exam and has been one year since last physical. She is fasting today.  She reports that she checks her blood pressure at home and gets high readings. She is taking the lisinopril daily as directed. Even with taking this medication when she checks at home it is getting systolic around 952 and says that it reads high at home-- as well as at our office today.  Discussed that her history also documents osteoporosis. She states that she "was on Fosamax for years but that was years ago". Says "can't be on that but for so long ". Sounds like she completed the 5 year course of therapy for that. Reports that she had bone density scan about one year ago. Says that was performed in Dr. Julianne Rice office building at [facility].  She is married and lives with her husband. Asked if they had a garden. She says that "it didn't do too much this year-- the tomatoes came out small ". 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    Review of Systems: Consitutional: No fever, chills, fatigue, night sweats, lymphadenopathy. No significant/unexplained weight changes. Eyes: No visual changes, eye redness, or  discharge. ENT/Mouth: No ear pain, sore throat, nasal drainage, or sinus pain. Cardiovascular: No chest pressure,heaviness, tightness or squeezing, even with exertion. No increased shortness of breath or dyspnea on exertion.No palpitations, edema, orthopnea, PND. Respiratory: No cough, hemoptysis, SOB, or wheezing. Gastrointestinal: No anorexia, dysphagia, reflux, pain, nausea, vomiting, hematemesis, diarrhea, constipation, BRBPR, or melena. Breast: No mass, nodules, bulging, or retraction. No skin changes or inflammation. No nipple discharge. No lymphadenopathy. Genitourinary: No dysuria, hematuria, incontinence, vaginal discharge, pruritis, burning, abnormal bleeding, or pain. Musculoskeletal: No decreased ROM, No joint pain or swelling. No significant pain in neck, back, or extremities. Skin: No rash, pruritis, or concerning lesions. Neurological: No headache, dizziness, syncope, seizures, tremors, memory loss, coordination problems, or paresthesias. Psychological: No anxiety, depression, hallucinations, SI/HI. Endocrine: No polydipsia, polyphagia, polyuria, or known diabetes.No increased fatigue. No palpitations/rapid heart rate. No significant/unexplained weight change. All other systems were reviewed and are otherwise negative.  Past Medical History:  Diagnosis Date  . Hypertension      Past Surgical History:  Procedure Laterality Date  . ABDOMINAL HYSTERECTOMY    . APPENDECTOMY      Home Meds:  Outpatient Medications Prior to Visit  Medication Sig Dispense Refill  . fish oil-omega-3 fatty acids 1000 MG capsule Take 1,200 mg by mouth daily.    Marland Kitchen lisinopril (PRINIVIL,ZESTRIL) 20 MG tablet Take 20 mg by mouth daily.    Marland Kitchen aspirin 81 MG tablet Take 81 mg by mouth daily.  No facility-administered medications prior to visit.     Allergies: No Known Allergies  Social History   Social History  . Marital status: Married    Spouse name: N/A  . Number of children: N/A  .  Years of education: N/A   Occupational History  . Not on file.   Social History Main Topics  . Smoking status: Never Smoker  . Smokeless tobacco: Never Used  . Alcohol use Not on file  . Drug use: Unknown  . Sexual activity: Not on file   Other Topics Concern  . Not on file   Social History Narrative  . No narrative on file    Family History  Problem Relation Age of Onset  . Colon cancer Maternal Aunt     Physical Exam: Blood pressure (!) 170/76, pulse (!) 58, temperature 97.6 F (36.4 C), temperature source Oral, resp. rate 16, height 4' 11.5" (1.511 m), weight 145 lb 9.6 oz (66 kg), SpO2 98 %., Body mass index is 28.92 kg/m. General: Well developed, well nourished WF. Appears in no acute distress. HEENT: Normocephalic, atraumatic. Conjunctiva pink, sclera non-icteric. Pupils 2 mm constricting to 1 mm, round, regular, and equally reactive to light and accomodation. EOMI. Internal auditory canal clear. TMs with good cone of light and without pathology. Nasal mucosa pink. Nares are without discharge. No sinus tenderness. Oral mucosa pink.  Pharynx without exudate.   Neck: Supple. Trachea midline. No thyromegaly. Full ROM. No lymphadenopathy.No Carotid Bruits. Lungs: Clear to auscultation bilaterally without wheezes, rales, or rhonchi. Breathing is of normal effort and unlabored. Cardiovascular: RRR with S1 S2. No murmurs, rubs, or gallops. Distal pulses 2+ symmetrically. No carotid or abdominal bruits. Breast: Symmetrical. No masses. Nipples without discharge. Abdomen: Soft, non-tender, non-distended with normoactive bowel sounds. No hepatosplenomegaly or masses. No rebound/guarding. No CVA tenderness. No hernias.  Musculoskeletal: Full range of motion and 5/5 strength throughout.  Skin: Warm and moist without erythema, ecchymosis, wounds, or rash. Neuro: A+Ox3. CN II-XII grossly intact. Moves all extremities spontaneously. Full sensation throughout. Normal gait. Psych:  Responds  to questions appropriately with a normal affect.   Assessment/Plan:  76 y.o. y/o female here for CPE  1. Encounter for medical examination to establish care 2. Encounter for preventive health examination  A. Screening Labs: - CBC with Differential/Platelet - COMPLETE METABOLIC PANEL WITH GFR - Lipid panel - TSH - VITAMIN D 25 Hydroxy (Vit-D Deficiency, Fractures)  B. Pap: No further Pap smear indicated. Age greater than 55. Also history of hysterectomy.  C. Screening Mammogram: She states that her mammogram is due in September and she will schedule that. Says that she has that done at the breast center.  D. DEXA/BMD:  She reports that she was on Fosamax for years but that was discontinued years ago because she had been on that for the completed course. States that she had a follow-up DEXA scan performed at [place] approximately one year ago--2017  E. Colorectal Cancer Screening: I have reviewed the colonoscopy report in epic. --She had colonoscopy 2014. Repeat 7 years.  F. Immunizations:  Influenza: She reports that she never gets a flu shot Tetanus:   His is not covered by Medicare so did not discuss this. Pneumococcal: 1 pneumonia vaccine is documented in epic. Patient thinks that she had one more recently as well. Will follow up on getting records from [facility]. Shingrix:---she defers   3. Medicare annual wellness visit, subsequent ----Information for this is documented below. See below.  4. Essential  hypertension Blood pressure is elevated today and she reports that blood pressure is elevated when she checks it at home.  She is to continue the lisinopril 20 mg daily.  She is to add Norvasc 5 mg daily.  She is to have follow-up office visit here in 2 weeks to recheck blood pressure. - COMPLETE METABOLIC PANEL WITH GFR - amLODipine (NORVASC) 5 MG tablet; Take 1 tablet (5 mg total) by mouth daily.  Dispense: 30 tablet; Refill: 0  5. Hyperlipidemia,  unspecified hyperlipidemia type She is fasting. Will check lipid panel. - COMPLETE METABOLIC PANEL WITH GFR - Lipid panel  6. Age-related osteoporosis without current pathological fracture She reports that she was on Fosamax for years but that was discontinued years ago because she had been on that for the completed course. States that she had a follow-up DEXA scan performed at [place] approximately one year ago--2017 She needs to be on calcium vitamin D and weightbearing exercise. Will check vitamin D level. - VITAMIN D 25 Hydroxy (Vit-D Deficiency, Fractures)   Subjective:   Patient presents for Medicare Annual/Subsequent preventive examination.   Review Past Medical/Family/Social: All of this information is reviewed today.  Risk Factors  Current exercise habits: She is active with working in her garden house and yard work. Dietary issues discussed: She does follow a low-sodium low-cholesterol low carbohydrate diet.  Cardiac risk factors: Hypertension, age  Depression Screen  (Note: if answer to either of the following is "Yes", a more complete depression screening is indicated)  Over the past two weeks, have you felt down, depressed or hopeless? No Over the past two weeks, have you felt little interest or pleasure in doing things? No Have you lost interest or pleasure in daily life? No Do you often feel hopeless? No Do you cry easily over simple problems? No   Activities of Daily Living  In your present state of health, do you have any difficulty performing the following activities?:  Driving? No  Managing money? No  Feeding yourself? No  Getting from bed to chair? No  Climbing a flight of stairs? No  Preparing food and eating?: No  Bathing or showering? No  Getting dressed: No  Getting to the toilet? No  Using the toilet:No  Moving around from place to place: No  In the past year have you fallen or had a near fall?:No  Are you sexually active? No  Do you  have more than one partner? No   Hearing Difficulties: No  Do you often ask people to speak up or repeat themselves? No  Do you experience ringing or noises in your ears? No Do you have difficulty understanding soft or whispered voices? No  Do you feel that you have a problem with memory? No Do you often misplace items? No  Do you feel safe at home? Yes  Cognitive Testing  Alert? Yes Normal Appearance?Yes  Oriented to person? Yes Place? Yes  Time? Yes  Recall of three objects? Yes  Can perform simple calculations? Yes  Displays appropriate judgment?Yes  Can read the correct time from a watch face?Yes   List the Names of Other Physician/Practitioners you currently use:  None  Indicate any recent Medical Services you may have received from other than [facility] providers in the past year (date may be approximate).   Screening Tests / Date----------------- all of this information is documented above. See above. Colonoscopy  Zostavax  Mammogram  Influenza Vaccine  Tetanus/tdap    Assessment:    Annual wellness medicare exam   Plan:    During the course of the visit the patient was educated and counseled about appropriate screening and preventive services including:  Screening mammography  Colorectal cancer screening  Shingles vaccine. Prescription given to that she can get the vaccine at the pharmacy or Medicare part D.  Screen + for depression. PHQ- 9 score of 12 (moderate depression). We discussed the options of counseling versus possibly a medication. I encouraged her strongly think about the counseling. She is going through some medical problems currently and her husband is as well Mrs. been very stressful for her. She says she will think about it. She does have Xanax to use as needed. Though she may benefit from an SSRI for her more depressive type symptoms but she wants to hold off at this time.  I aksed her to please have her cardioloist send records since  we have none on file.  Diet review for nutrition referral? Yes ____ Not Indicated __x__  Patient Instructions (the written plan) was given to the patient.  Medicare Attestation  I have personally reviewed:  The patient's medical and social history  Their use of alcohol, tobacco or illicit drugs  Their current medications and supplements  The patient's functional ability including ADLs,fall risks, home safety risks, cognitive, and hearing and visual impairment  Diet and physical activities  Evidence for depression or mood disorders  The patient's weight, height, BMI, and visual acuity have been recorded in the chart. I have made referrals, counseling, and provided education to the patient based on review of the above and I have provided the patient with a written personalized care plan for preventive services.       Signed, [address] [place], [place], [facility] 09/29/2016 9:52 AM

## 2016-09-30 LAB — LIPID PANEL
CHOL/HDL RATIO: 3.1 ratio (ref <5.0)
Cholesterol: 223 mg/dL — ABNORMAL HIGH (ref <200)
HDL: 72 mg/dL (ref >50)
LDL Cholesterol: 137 mg/dL — ABNORMAL HIGH (ref <100)
Triglycerides: 72 mg/dL (ref <150)
VLDL: 14 mg/dL (ref <30)

## 2016-09-30 LAB — COMPLETE METABOLIC PANEL WITH GFR
ALT: 13 U/L (ref 6–29)
AST: 15 U/L (ref 10–35)
Albumin: 4.1 g/dL (ref 3.6–5.1)
Alkaline Phosphatase: 59 U/L (ref 33–130)
BUN: 15 mg/dL (ref 7–25)
CHLORIDE: 106 mmol/L (ref 98–110)
CO2: 23 mmol/L (ref 20–32)
Calcium: 9.3 mg/dL (ref 8.6–10.4)
Creat: 0.74 mg/dL (ref 0.60–0.93)
GFR, EST NON AFRICAN AMERICAN: 79 mL/min (ref >=60)
GFR, Est African American: >89 mL/min (ref >=60)
GLUCOSE: 93 mg/dL (ref 70–99)
POTASSIUM: 4.4 mmol/L (ref 3.5–5.3)
SODIUM: 139 mmol/L (ref 135–146)
Total Bilirubin: 0.4 mg/dL (ref 0.2–1.2)
Total Protein: 6.5 g/dL (ref 6.1–8.1)

## 2016-09-30 LAB — VITAMIN D 25 HYDROXY (VIT D DEFICIENCY, FRACTURES): VIT D 25 HYDROXY: 57 ng/mL (ref 30–100)

## 2016-10-13 ENCOUNTER — Ambulatory Visit (INDEPENDENT_AMBULATORY_CARE_PROVIDER_SITE_OTHER): Payer: Medicare HMO | Admitting: Physician Assistant

## 2016-10-13 ENCOUNTER — Encounter: Payer: Self-pay | Admitting: Physician Assistant

## 2016-10-13 VITALS — BP 140/70 | HR 59 | Temp 97.6°F | Resp 16 | Ht 60.0 in | Wt 145.6 lb

## 2016-10-13 DIAGNOSIS — M810 Age-related osteoporosis without current pathological fracture: Secondary | ICD-10-CM | POA: Diagnosis not present

## 2016-10-13 DIAGNOSIS — I10 Essential (primary) hypertension: Secondary | ICD-10-CM

## 2016-10-13 DIAGNOSIS — E785 Hyperlipidemia, unspecified: Secondary | ICD-10-CM

## 2016-10-13 MED ORDER — AMLODIPINE BESYLATE 5 MG PO TABS: 5.0000 mg | ORAL_TABLET | Freq: Every day | ORAL | 2 refills | Status: DC

## 2016-10-13 NOTE — Progress Notes (Signed)
Patient ID: HOMER PFEIFER MRN: [id number], DOB: January 23, 1941, 76 y.o. Date of Encounter: 10/13/2016,   Chief Complaint: Hypertension  HPI: 76 y.o. y/o female   09/29/2016:  here for CPE.   She reports that she has been seeing Dr. Maudie Mercury in [place]. At [facility]--located on [address]. She reports that she has 2 sisters and her brother-in-law who come here to this office. States that this location is better for her.  She reports that her last routine visit with the prior PCP was February 2018 and that her next visit there was scheduled for next Friday but she is canceling that.  States that that appointment for next week was for a complete physical exam and has been one year since last physical. She is fasting today.  She reports that she checks her blood pressure at home and gets high readings. She is taking the lisinopril daily as directed. Even with taking this medication when she checks at home it is getting systolic around 086 and says that it reads high at home-- as well as at our office today.  Discussed that her history also documents osteoporosis. She states that she "was on Fosamax for years but that was years ago". Says "can't be on that but for so long ". Sounds like she completed the 5 year course of therapy for that. Reports that she had bone density scan about one year ago. Says that was performed in Dr. Julianne Rice office building at [facility].  She is married and lives with her husband. Asked if they had a garden. She says that "it didn't do too much this year-- the tomatoes came out small ".  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  AT THAT VISIT: UPDATED PREVENTIVE CARE DID MEDICARE PHYSICAL ADDED NORVASC 5mg  QD F/U OV 2 WEEKS   10/13/2016: Today she reports that she did add the Norvasc 5 mg and  has been taking this daily. She is having no adverse effects. She reports that she has been checking her blood pressure at home some. Says that she is getting similar readings as well we got today with systolic right around 578. Says that she and her neighbor has started walking 4 miles per day. Is that she had done this in the past but then had slacked off. Says that she doesn't walk every single day but will try to continue walking.   Review of Systems: Consitutional: No fever, chills, fatigue, night sweats, lymphadenopathy. No significant/unexplained weight changes. Eyes: No visual changes, eye redness, or discharge. ENT/Mouth: No ear pain, sore throat, nasal drainage, or sinus pain. Cardiovascular: No chest pressure,heaviness, tightness or squeezing, even with exertion. No increased shortness of breath or dyspnea on exertion.No palpitations, edema, orthopnea, PND. Respiratory: No cough, hemoptysis, SOB, or wheezing. Gastrointestinal: No anorexia, dysphagia, reflux, pain, nausea, vomiting, hematemesis, diarrhea, constipation, BRBPR, or melena. Breast: No mass, nodules, bulging, or retraction. No skin changes or inflammation. No nipple discharge. No lymphadenopathy. Genitourinary: No dysuria, hematuria, incontinence, vaginal discharge, pruritis, burning, abnormal bleeding, or pain. Musculoskeletal: No decreased ROM, No joint pain or swelling. No significant pain in neck, back, or extremities. Skin: No rash, pruritis, or concerning lesions. Neurological: No headache, dizziness, syncope, seizures, tremors, memory loss, coordination problems, or paresthesias. Psychological: No anxiety, depression, hallucinations, SI/HI. Endocrine: No polydipsia, polyphagia, polyuria, or known  diabetes.No increased fatigue. No palpitations/rapid heart rate. No significant/unexplained weight change. All other systems were reviewed and are otherwise negative.  Past Medical History:  Diagnosis Date  . Hypertension       Past Surgical History:  Procedure Laterality Date  . ABDOMINAL HYSTERECTOMY    . APPENDECTOMY      Home Meds:  Outpatient Medications Prior to Visit  Medication Sig Dispense Refill  . amLODipine (NORVASC) 5 MG tablet Take 1 tablet (5 mg total) by mouth daily. 30 tablet 0  . fish oil-omega-3 fatty acids 1000 MG capsule Take 1,200 mg by mouth daily.    Marland Kitchen lisinopril (PRINIVIL,ZESTRIL) 20 MG tablet Take 20 mg by mouth daily.    . simvastatin (ZOCOR) 5 MG tablet Take 5 mg by mouth at bedtime.     No facility-administered medications prior to visit.     Allergies: No Known Allergies  Social History   Social History  . Marital status: Married    Spouse name: N/A  . Number of children: N/A  . Years of education: N/A   Occupational History  . Not on file.   Social History Main Topics  . Smoking status: Never Smoker  . Smokeless tobacco: Never Used  . Alcohol use Not on file  . Drug use: Unknown  . Sexual activity: Not on file   Other Topics Concern  . Not on file   Social History Narrative  . No narrative on file    Family History  Problem Relation Age of Onset  . Colon cancer Maternal Aunt     Physical Exam: Blood pressure 140/70, pulse (!) 59, temperature 97.6 F (36.4 C), temperature source Oral, resp. rate 16, height 5' (1.524 m), weight 145 lb 9.6 oz (66 kg), SpO2 98 %., There is no height or weight on file to calculate BMI. General: Well developed, well nourished WF. Appears in no acute distress. Neck: Supple. Trachea midline. No thyromegaly. Full ROM. No lymphadenopathy.No Carotid Bruits. Lungs: Clear to auscultation bilaterally without wheezes, rales, or rhonchi. Breathing is of normal effort and unlabored. Cardiovascular: RRR with S1 S2. No murmurs, rubs, or gallops. Distal pulses 2+ symmetrically. No carotid or abdominal bruits. Musculoskeletal: Full range of motion and 5/5 strength throughout.  Skin: Warm and moist without erythema, ecchymosis,  wounds, or rash. Neuro: A+Ox3. CN II-XII grossly intact. Moves all extremities spontaneously. Full sensation throughout. Normal gait. Psych:  Responds to questions appropriately with a normal affect.   Assessment/Plan:  76 y.o. y/o female here for    Essential hypertension 10/13/2016:Systolic Blood pressure is borderline.  However, since she checks blood pressure at home will continue medication the same and will let her continue to monitor at home. As well she states the blood pressure did drop significantly with adding this Norvasc 5. Says that she had been getting systolics close to 182 and now is down to 140. Also she reports that she has started back to walking 4 miles on most days in hopes/plans to continue this. I wrote down on paper and reviewed with her that if she gets systolic readings consistently greater than 140 to call me and I can adjust blood pressure medication. Otherwise at this time will continue medication the same. Norvasc 5 mg daily. Lisinopril 20 mg daily.  Hyperlipidemia, unspecified hyperlipidemia type 10/13/2016: She is on simvastatin 5 mg daily. On this medication at this dose she did lipid panel 09/29/16. This shows HDL excellent at 72. LDL 137. We'll continue current dose of simvastatin 5 mg.  Age-related osteoporosis without current pathological fracture At OV 09/29/2016 --She reported that she was on Fosamax for years but that was discontinued years ago because she had been on that for the completed course. States that she had a follow-up DEXA scan performed at [place] approximately one year ago--2017 She needs to be on calcium vitamin D and weightbearing exercise. Will check vitamin D level. - VITAMIN D 25 Hydroxy (Vit-D Deficiency, Fractures) 10/13/16: She has completed course of Fosamax.  Vitamin D level was checked 09/29/16 and was excellent. Level was 57. She is to continue current dose of vitamin D supplements.   She will monitor blood pressure  at home and will call me if consistently getting readings greater than 505 systolic. Will plan for next routine office visit to be in 6 months. Follow-up sooner if needed.   THE FOLLOWING IS COPIED FROM OV NOTE 09/29/2016---NOT ADDRESSED AT OV 10/13/2016:    1. Encounter for medical examination to establish care 2. Encounter for preventive health examination 3. Medicare annual wellness visit, subsequent  A. Screening Labs: - CBC with Differential/Platelet - COMPLETE METABOLIC PANEL WITH GFR - Lipid panel - TSH - VITAMIN D 25 Hydroxy (Vit-D Deficiency, Fractures)  B. Pap: No further Pap smear indicated. Age greater than 67. Also history of hysterectomy.  C. Screening Mammogram: She states that her mammogram is due in September and she will schedule that. Says that she has that done at the breast center.  D. DEXA/BMD:  She reports that she was on Fosamax for years but that was discontinued years ago because she had been on that for the completed course. States that she had a follow-up DEXA scan performed at [place] approximately one year ago--2017  E. Colorectal Cancer Screening: I have reviewed the colonoscopy report in epic. --She had colonoscopy 2014. Repeat 7 years.  F. Immunizations:  Influenza: She reports that she never gets a flu shot Tetanus:   His is not covered by Medicare so did not discuss this. Pneumococcal: 1 pneumonia vaccine is documented in epic. Patient thinks that she had one more recently as well. Will follow up on getting records from [place]. Shingrix:---she defers     Signed, Olean Ree [place], [place], [facility] 10/13/2016 8:11 AM

## 2016-11-11 ENCOUNTER — Other Ambulatory Visit: Payer: Self-pay | Admitting: Physician Assistant

## 2016-11-11 ENCOUNTER — Other Ambulatory Visit: Payer: Self-pay | Admitting: Internal Medicine

## 2016-11-11 DIAGNOSIS — Z1231 Encounter for screening mammogram for malignant neoplasm of breast: Secondary | ICD-10-CM

## 2016-11-24 DIAGNOSIS — R69 Illness, unspecified: Secondary | ICD-10-CM | POA: Diagnosis not present

## 2016-12-02 ENCOUNTER — Ambulatory Visit
Admission: RE | Admit: 2016-12-02 | Discharge: 2016-12-02 | Disposition: A | Discharge status: Home / Self Care | Payer: Medicare HMO | Source: Ambulatory Visit | Attending: Physician Assistant | Admitting: Physician Assistant

## 2016-12-02 DIAGNOSIS — Z1231 Encounter for screening mammogram for malignant neoplasm of breast: Secondary | ICD-10-CM

## 2016-12-02 LAB — HM MAMMOGRAPHY

## 2017-01-20 DIAGNOSIS — H40013 Open angle with borderline findings, low risk, bilateral: Secondary | ICD-10-CM | POA: Diagnosis not present

## 2017-01-20 DIAGNOSIS — H21233 Degeneration of iris (pigmentary), bilateral: Secondary | ICD-10-CM | POA: Diagnosis not present

## 2017-01-20 DIAGNOSIS — H524 Presbyopia: Secondary | ICD-10-CM | POA: Diagnosis not present

## 2017-01-20 DIAGNOSIS — H2513 Age-related nuclear cataract, bilateral: Secondary | ICD-10-CM | POA: Diagnosis not present

## 2017-03-20 DIAGNOSIS — M62830 Muscle spasm of back: Secondary | ICD-10-CM | POA: Diagnosis not present

## 2017-03-20 DIAGNOSIS — M9901 Segmental and somatic dysfunction of cervical region: Secondary | ICD-10-CM | POA: Diagnosis not present

## 2017-03-20 DIAGNOSIS — M9902 Segmental and somatic dysfunction of thoracic region: Secondary | ICD-10-CM | POA: Diagnosis not present

## 2017-03-20 DIAGNOSIS — M9903 Segmental and somatic dysfunction of lumbar region: Secondary | ICD-10-CM | POA: Diagnosis not present

## 2017-03-22 DIAGNOSIS — M9902 Segmental and somatic dysfunction of thoracic region: Secondary | ICD-10-CM | POA: Diagnosis not present

## 2017-03-22 DIAGNOSIS — M9901 Segmental and somatic dysfunction of cervical region: Secondary | ICD-10-CM | POA: Diagnosis not present

## 2017-03-22 DIAGNOSIS — M62830 Muscle spasm of back: Secondary | ICD-10-CM | POA: Diagnosis not present

## 2017-03-22 DIAGNOSIS — M9903 Segmental and somatic dysfunction of lumbar region: Secondary | ICD-10-CM | POA: Diagnosis not present

## 2017-03-23 DIAGNOSIS — M9903 Segmental and somatic dysfunction of lumbar region: Secondary | ICD-10-CM | POA: Diagnosis not present

## 2017-03-23 DIAGNOSIS — M62830 Muscle spasm of back: Secondary | ICD-10-CM | POA: Diagnosis not present

## 2017-03-23 DIAGNOSIS — M9901 Segmental and somatic dysfunction of cervical region: Secondary | ICD-10-CM | POA: Diagnosis not present

## 2017-03-23 DIAGNOSIS — M9902 Segmental and somatic dysfunction of thoracic region: Secondary | ICD-10-CM | POA: Diagnosis not present

## 2017-03-27 DIAGNOSIS — M9903 Segmental and somatic dysfunction of lumbar region: Secondary | ICD-10-CM | POA: Diagnosis not present

## 2017-03-27 DIAGNOSIS — M62830 Muscle spasm of back: Secondary | ICD-10-CM | POA: Diagnosis not present

## 2017-03-27 DIAGNOSIS — M9901 Segmental and somatic dysfunction of cervical region: Secondary | ICD-10-CM | POA: Diagnosis not present

## 2017-03-27 DIAGNOSIS — M9902 Segmental and somatic dysfunction of thoracic region: Secondary | ICD-10-CM | POA: Diagnosis not present

## 2017-03-29 DIAGNOSIS — M9901 Segmental and somatic dysfunction of cervical region: Secondary | ICD-10-CM | POA: Diagnosis not present

## 2017-03-29 DIAGNOSIS — M9902 Segmental and somatic dysfunction of thoracic region: Secondary | ICD-10-CM | POA: Diagnosis not present

## 2017-03-29 DIAGNOSIS — M62830 Muscle spasm of back: Secondary | ICD-10-CM | POA: Diagnosis not present

## 2017-03-29 DIAGNOSIS — M9903 Segmental and somatic dysfunction of lumbar region: Secondary | ICD-10-CM | POA: Diagnosis not present

## 2017-04-13 ENCOUNTER — Ambulatory Visit (INDEPENDENT_AMBULATORY_CARE_PROVIDER_SITE_OTHER): Payer: Medicare HMO | Admitting: Physician Assistant

## 2017-04-13 ENCOUNTER — Encounter: Payer: Self-pay | Admitting: Physician Assistant

## 2017-04-13 ENCOUNTER — Other Ambulatory Visit: Payer: Self-pay

## 2017-04-13 VITALS — BP 150/78 | HR 66 | Temp 97.9°F | Resp 14 | Ht 60.0 in | Wt 143.0 lb

## 2017-04-13 DIAGNOSIS — E785 Hyperlipidemia, unspecified: Secondary | ICD-10-CM | POA: Diagnosis not present

## 2017-04-13 DIAGNOSIS — I10 Essential (primary) hypertension: Secondary | ICD-10-CM | POA: Diagnosis not present

## 2017-04-13 MED ORDER — AMLODIPINE BESYLATE 10 MG PO TABS: 10.0000 mg | ORAL_TABLET | Freq: Every day | ORAL | 11 refills | Status: DC

## 2017-04-13 NOTE — Progress Notes (Signed)
Patient ID: Jillian Koch MRN: [id number], DOB: 1940/03/09, 77 y.o. Date of Encounter: 04/13/2017,   Chief Complaint: Hypertension  HPI: 77 y.o. y/o female   09/29/2016:  here for CPE.   She reports that she has been seeing Dr. Maudie Mercury in [place]. At [facility]--located on [address]. She reports that she has 2 sisters and her brother-in-law who come here to this office. States that this location is better for her.  She reports that her last routine visit with the prior PCP was February 2018 and that her next visit there was scheduled for next Friday but she is canceling that.  States that that appointment for next week was for a complete physical exam and has been one year since last physical. She is fasting today.  She reports that she checks her blood pressure at home and gets high readings. She is taking the lisinopril daily as directed. Even with taking this medication when she checks at home it is getting systolic around 518 and says that it reads high at home-- as well as at our office today.  Discussed that her history also documents osteoporosis. She states that she "was on Fosamax for years but that was years ago". Says "can't be on that but for so long ". Sounds like she completed the 5 year course of therapy for that. Reports that she had bone density scan about one year ago. Says that was performed in Dr. Julianne Rice office building at [facility].  She is married and lives with her husband. Asked if they had a garden. She says that "it didn't do too much this year-- the tomatoes came out small ".  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  AT THAT VISIT: UPDATED PREVENTIVE CARE DID MEDICARE PHYSICAL ADDED NORVASC 5mg  QD F/U OV 2 WEEKS   10/13/2016: Today she reports that she did add the Norvasc 5 mg and  has been taking this daily. She is having no adverse effects. She reports that she has been checking her blood pressure at home some. Says that she is getting similar readings as well we got today with systolic right around 841. Says that she and her neighbor has started walking 4 miles per day. Says that she had done this in the past but then had slacked off. Says that she doesn't walk every single day but will try to continue walking.   04/13/2017: Today she reports that she is taking both the Norvasc and the lisinopril as directed.  Having no lightheadedness or other adverse effects. She also reports that she continues to take the Zocor daily.  Having no myalgias or other adverse effects with this. Reports that she was sick with a cold over the past week but that is resolving and she is feeling better now.   Overall she has been doing well up until that illness.   However states that she has not been doing her walking.   Discussed that was so much rain over the past year it is difficult to have been doing much walking.   She says that she does go to [place] and walk there some.  Says that now that she is feeling better may be she needs to get back to doing that.     Review of Systems: Consitutional: No fever, chills, fatigue, night  sweats, lymphadenopathy. No significant/unexplained weight changes. Eyes: No visual changes, eye redness, or discharge. ENT/Mouth: No ear pain, sore throat, nasal drainage, or sinus pain. Cardiovascular: No chest pressure,heaviness, tightness or squeezing, even with exertion. No increased shortness of breath or dyspnea on exertion.No palpitations, edema, orthopnea, PND. Respiratory: No cough, hemoptysis, SOB, or wheezing. Gastrointestinal: No anorexia, dysphagia, reflux, pain, nausea, vomiting, hematemesis, diarrhea, constipation, BRBPR, or melena. Breast: No mass, nodules, bulging, or retraction. No skin changes or inflammation. No nipple discharge. No  lymphadenopathy. Genitourinary: No dysuria, hematuria, incontinence, vaginal discharge, pruritis, burning, abnormal bleeding, or pain. Musculoskeletal: No decreased ROM, No joint pain or swelling. No significant pain in neck, back, or extremities. Skin: No rash, pruritis, or concerning lesions. Neurological: No headache, dizziness, syncope, seizures, tremors, memory loss, coordination problems, or paresthesias. Psychological: No anxiety, depression, hallucinations, SI/HI. Endocrine: No polydipsia, polyphagia, polyuria, or known diabetes.No increased fatigue. No palpitations/rapid heart rate. No significant/unexplained weight change. All other systems were reviewed and are otherwise negative.  Past Medical History:  Diagnosis Date  . Hypertension      Past Surgical History:  Procedure Laterality Date  . ABDOMINAL HYSTERECTOMY    . APPENDECTOMY      Home Meds:  Outpatient Medications Prior to Visit  Medication Sig Dispense Refill  . amLODipine (NORVASC) 5 MG tablet Take 1 tablet (5 mg total) by mouth daily. 90 tablet 2  . fish oil-omega-3 fatty acids 1000 MG capsule Take 1,200 mg by mouth daily.    Marland Kitchen lisinopril (PRINIVIL,ZESTRIL) 20 MG tablet Take 20 mg by mouth daily.    . simvastatin (ZOCOR) 5 MG tablet Take 5 mg by mouth at bedtime.     No facility-administered medications prior to visit.     Allergies: No Known Allergies  Social History   Socioeconomic History  . Marital status: Married    Spouse name: Not on file  . Number of children: Not on file  . Years of education: Not on file  . Highest education level: Not on file  Social Needs  . Financial resource strain: Not on file  . Food insecurity - worry: Not on file  . Food insecurity - inability: Not on file  . Transportation needs - medical: Not on file  . Transportation needs - non-medical: Not on file  Occupational History  . Not on file  Tobacco Use  . Smoking status: Never Smoker  . Smokeless tobacco: Never  Used  Substance and Sexual Activity  . Alcohol use: Not on file  . Drug use: Not on file  . Sexual activity: Not on file  Other Topics Concern  . Not on file  Social History Narrative  . Not on file    Family History  Problem Relation Age of Onset  . Colon cancer Maternal Aunt   . Breast cancer Sister     Physical Exam: Blood pressure (!) 150/78, pulse 66, temperature 97.9 F (36.6 C), temperature source Oral, resp. rate 14, height 5' (1.524 m), weight 64.9 kg (143 lb), SpO2 98 %., There is no height or weight on file to calculate BMI. General: Well developed, well nourished WF. Appears in no acute distress. Neck: Supple. Trachea midline. No thyromegaly. Full ROM. No lymphadenopathy.No Carotid Bruits. Lungs: Clear to auscultation bilaterally without wheezes, rales, or rhonchi. Breathing is of normal effort and unlabored. Cardiovascular: RRR with S1 S2. No murmurs, rubs, or gallops. Distal pulses 2+ symmetrically. No carotid or abdominal bruits. Musculoskeletal: Full range of motion and 5/5 strength throughout.  Skin:  Warm and moist without erythema, ecchymosis, wounds, or rash. Neuro: A+Ox3. CN II-XII grossly intact. Moves all extremities spontaneously. Full sensation throughout. Normal gait. Psych:  Responds to questions appropriately with a normal affect.   Assessment/Plan:  77 y.o. y/o female here for    Essential hypertension 10/13/2016:Systolic Blood pressure is borderline.  However, since she checks blood pressure at home will continue medication the same and will let her continue to monitor at home. As well she states the blood pressure did drop significantly with adding this Norvasc 5. Says that she had been getting systolics close to 333 and now is down to 140. Also she reports that she has started back to walking 4 miles on most days in hopes/plans to continue this. I wrote down on paper and reviewed with her that if she gets systolic readings consistently greater than  140 to call me and I can adjust blood pressure medication. Otherwise at this time will continue medication the same. Norvasc 5 mg daily. Lisinopril 20 mg daily. 04/13/2017:  Blood pressure reading high today by nurse.  Patient reports that she checks at home and the systolic is always in the 130s and 140s.  Rechecked it myself and here it clearly on the left I am getting 160/80. At this time will increase Norvasc to 10 mg daily. (She has absolutely no lower extremity edema--currently on the 5 mg Norvasc) She will return for follow-up visit in 2 weeks to recheck blood pressure.  As well she will be checking blood pressure at home some in the interim so that we can discuss those readings as well.   Hyperlipidemia, unspecified hyperlipidemia type 10/13/2016: She is on simvastatin 5 mg daily. On this medication at this dose she did lipid panel 09/29/16. This shows HDL excellent at 72. LDL 137. Will continue current dose of simvastatin 5 mg. 04/13/2017: She is fasting.  Recheck lab to monitor.  She is taking the simvastatin 5 mg daily.  Age-related osteoporosis without current pathological fracture At OV 09/29/2016 --She reported that she was on Fosamax for years but that was discontinued years ago because she had been on that for the completed course. States that she had a follow-up DEXA scan performed at [place] approximately one year ago--2017 She needs to be on calcium vitamin D and weightbearing exercise. Will check vitamin D level. - VITAMIN D 25 Hydroxy (Vit-D Deficiency, Fractures) 04/13/2017: She has completed course of Fosamax.  Vitamin D level was checked 09/29/16 and was excellent. Level was 57. She is to continue current dose of vitamin D supplements.     FOR PREVENTIVE CARE-----SEE NOTE FROM OV NOTE 09/29/2016---       Signed, Olean Ree [place], [place], [facility] 04/13/2017 7:56 AM

## 2017-04-14 LAB — COMPLETE METABOLIC PANEL WITH GFR
AG Ratio: 1.6 (calc) (ref 1.0–2.5)
ALBUMIN MSPROF: 4.2 g/dL (ref 3.6–5.1)
ALT: 20 U/L (ref 6–29)
AST: 20 U/L (ref 10–35)
Alkaline phosphatase (APISO): 48 U/L (ref 33–130)
BUN: 18 mg/dL (ref 7–25)
CALCIUM: 9.5 mg/dL (ref 8.6–10.4)
CO2: 26 mmol/L (ref 20–32)
Chloride: 104 mmol/L (ref 98–110)
Creat: 0.84 mg/dL (ref 0.60–0.93)
GFR, EST AFRICAN AMERICAN: 78 mL/min/{1.73_m2} (ref >=60)
GFR, EST NON AFRICAN AMERICAN: 68 mL/min/{1.73_m2} (ref >=60)
GLUCOSE: 98 mg/dL (ref 65–99)
Globulin: 2.6 g/dL (calc) (ref 1.9–3.7)
Potassium: 4.2 mmol/L (ref 3.5–5.3)
Sodium: 139 mmol/L (ref 135–146)
TOTAL PROTEIN: 6.8 g/dL (ref 6.1–8.1)
Total Bilirubin: 0.6 mg/dL (ref 0.2–1.2)

## 2017-04-14 LAB — LIPID PANEL
CHOL/HDL RATIO: 4 (calc) (ref <5.0)
CHOLESTEROL: 202 mg/dL — AB (ref <200)
HDL: 50 mg/dL — ABNORMAL LOW (ref >50)
LDL CHOLESTEROL (CALC): 128 mg/dL — AB
NON-HDL CHOLESTEROL (CALC): 152 mg/dL — AB (ref <130)
Triglycerides: 125 mg/dL (ref <150)

## 2017-04-14 LAB — EXTRA LAV TOP TUBE

## 2017-04-26 DIAGNOSIS — M9903 Segmental and somatic dysfunction of lumbar region: Secondary | ICD-10-CM | POA: Diagnosis not present

## 2017-04-26 DIAGNOSIS — M9901 Segmental and somatic dysfunction of cervical region: Secondary | ICD-10-CM | POA: Diagnosis not present

## 2017-04-26 DIAGNOSIS — M9902 Segmental and somatic dysfunction of thoracic region: Secondary | ICD-10-CM | POA: Diagnosis not present

## 2017-04-26 DIAGNOSIS — M62830 Muscle spasm of back: Secondary | ICD-10-CM | POA: Diagnosis not present

## 2017-04-27 ENCOUNTER — Ambulatory Visit (INDEPENDENT_AMBULATORY_CARE_PROVIDER_SITE_OTHER): Payer: Medicare HMO | Admitting: Physician Assistant

## 2017-04-27 ENCOUNTER — Encounter: Payer: Self-pay | Admitting: Physician Assistant

## 2017-04-27 VITALS — BP 138/70 | HR 61 | Temp 97.6°F | Resp 14 | Wt 144.0 lb

## 2017-04-27 DIAGNOSIS — I10 Essential (primary) hypertension: Secondary | ICD-10-CM | POA: Diagnosis not present

## 2017-04-27 MED ORDER — AMLODIPINE BESYLATE 10 MG PO TABS: 10.0000 mg | ORAL_TABLET | Freq: Every day | ORAL | 2 refills | Status: DC

## 2017-04-27 NOTE — Addendum Note (Signed)
Addended by: Dena Billet B on: 04/27/2017 08:39 AM   Modules accepted: Orders

## 2017-04-27 NOTE — Progress Notes (Signed)
Patient ID: Jillian Koch MRN: [id number], DOB: 1940-03-06, 77 y.o. Date of Encounter: 04/27/2017,   Chief Complaint: Hypertension  HPI: 77 y.o. y/o female   09/29/2016:  here for CPE.   She reports that she has been seeing Dr. Maudie Mercury in [place]. At [facility]--located on [address]. She reports that she has 2 sisters and her brother-in-law who come here to this office. States that this location is better for her.  She reports that her last routine visit with the prior PCP was February 2018 and that her next visit there was scheduled for next Friday but she is canceling that.  States that that appointment for next week was for a complete physical exam and has been one year since last physical. She is fasting today.  She reports that she checks her blood pressure at home and gets high readings. She is taking the lisinopril daily as directed. Even with taking this medication when she checks at home it is getting systolic around 240 and says that it reads high at home-- as well as at our office today.  Discussed that her history also documents osteoporosis. She states that she "was on Fosamax for years but that was years ago". Says "can't be on that but for so long ". Sounds like she completed the 5 year course of therapy for that. Reports that she had bone density scan about one year ago. Says that was performed in Dr. Julianne Rice office building at [facility].  She is married and lives with her husband. Asked if they had a garden. She says that "it didn't do too much this year-- the tomatoes came out small ".  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  AT THAT VISIT: UPDATED PREVENTIVE CARE DID MEDICARE PHYSICAL ADDED NORVASC 5mg  QD F/U OV 2 WEEKS   10/13/2016: Today she reports that she did add the Norvasc 5 mg and  has been taking this daily. She is having no adverse effects. She reports that she has been checking her blood pressure at home some. Says that she is getting similar readings as well we got today with systolic right around 973. Says that she and her neighbor has started walking 4 miles per day. Says that she had done this in the past but then had slacked off. Says that she doesn't walk every single day but will try to continue walking.   04/13/2017: Today she reports that she is taking both the Norvasc and the lisinopril as directed.  Having no lightheadedness or other adverse effects. She also reports that she continues to take the Zocor daily.  Having no myalgias or other adverse effects with this. Reports that she was sick with a cold over the past week but that is resolving and she is feeling better now.   Overall she has been doing well up until that illness.   However states that she has not been doing her walking.   Discussed that was so much rain over the past year it is difficult to have been doing much walking.   She says that she does go to [place] and walk there some.  Says that now that she is feeling better may be she needs to get back to doing that.   04/27/2017: At her visit 04/13/2017 nurse had blood pressure reading 150/78  and when I rechecked I got 160/80.  At that visit she had absolutely no lower extremity edema on exam and had been taking Norvasc 5 mg up to that point.  At that visit increased Norvasc to 77 mg daily. Today she reports that she has been taking the Norvasc at 10 mg daily.  She has had no lower extremity edema.  Has been checking blood pressure at home and is getting systolic in the 846N.      Review of Systems: Consitutional: No fever, chills, fatigue, night sweats, lymphadenopathy. No significant/unexplained weight changes. Eyes: No visual changes, eye redness, or discharge. ENT/Mouth: No ear pain, sore throat, nasal drainage, or sinus  pain. Cardiovascular: No chest pressure,heaviness, tightness or squeezing, even with exertion. No increased shortness of breath or dyspnea on exertion.No palpitations, edema, orthopnea, PND. Respiratory: No cough, hemoptysis, SOB, or wheezing. Gastrointestinal: No anorexia, dysphagia, reflux, pain, nausea, vomiting, hematemesis, diarrhea, constipation, BRBPR, or melena. Breast: No mass, nodules, bulging, or retraction. No skin changes or inflammation. No nipple discharge. No lymphadenopathy. Genitourinary: No dysuria, hematuria, incontinence, vaginal discharge, pruritis, burning, abnormal bleeding, or pain. Musculoskeletal: No decreased ROM, No joint pain or swelling. No significant pain in neck, back, or extremities. Skin: No rash, pruritis, or concerning lesions. Neurological: No headache, dizziness, syncope, seizures, tremors, memory loss, coordination problems, or paresthesias. Psychological: No anxiety, depression, hallucinations, SI/HI. Endocrine: No polydipsia, polyphagia, polyuria, or known diabetes.No increased fatigue. No palpitations/rapid heart rate. No significant/unexplained weight change. All other systems were reviewed and are otherwise negative.  Past Medical History:  Diagnosis Date  . Hypertension      Past Surgical History:  Procedure Laterality Date  . ABDOMINAL HYSTERECTOMY    . APPENDECTOMY      Home Meds:  Outpatient Medications Prior to Visit  Medication Sig Dispense Refill  . amLODipine (NORVASC) 10 MG tablet Take 1 tablet (10 mg total) by mouth daily. 30 tablet 11  . fish oil-omega-3 fatty acids 1000 MG capsule Take 1,200 mg by mouth daily.    Marland Kitchen lisinopril (PRINIVIL,ZESTRIL) 20 MG tablet Take 20 mg by mouth daily.    . simvastatin (ZOCOR) 5 MG tablet Take 5 mg by mouth at bedtime.     No facility-administered medications prior to visit.     Allergies: No Known Allergies  Social History   Socioeconomic History  . Marital status: Married    Spouse  name: Not on file  . Number of children: Not on file  . Years of education: Not on file  . Highest education level: Not on file  Social Needs  . Financial resource strain: Not on file  . Food insecurity - worry: Not on file  . Food insecurity - inability: Not on file  . Transportation needs - medical: Not on file  . Transportation needs - non-medical: Not on file  Occupational History  . Not on file  Tobacco Use  . Smoking status: Never Smoker  . Smokeless tobacco: Never Used  Substance and Sexual Activity  . Alcohol use: Not on file  . Drug use: Not on file  . Sexual activity: Not on file  Other Topics Concern  . Not on file  Social History Narrative  . Not on file    Family History  Problem Relation Age of Onset  . Colon cancer Maternal Aunt   . Breast cancer Sister     Physical Exam: Blood pressure 138/70, pulse 61, temperature 97.6 F (36.4 C), temperature source Oral, resp. rate 14, weight 65.3  kg (144 lb), SpO2 98 %., There is no height or weight on file to calculate BMI. General: Well developed, well nourished WF. Appears in no acute distress. Neck: Supple. Trachea midline. No thyromegaly. Full ROM. No lymphadenopathy.No Carotid Bruits. Lungs: Clear to auscultation bilaterally without wheezes, rales, or rhonchi. Breathing is of normal effort and unlabored. Cardiovascular: RRR with S1 S2. No murmurs, rubs, or gallops. Distal pulses 2+ symmetrically. No carotid or abdominal bruits. Musculoskeletal: Full range of motion and 5/5 strength throughout.  Skin: Warm and moist.  No lower extremity edema. Neuro: A+Ox3. CN II-XII grossly intact. Moves all extremities spontaneously. Full sensation throughout. Normal gait. Psych:  Responds to questions appropriately with a normal affect.   Assessment/Plan:  77 y.o. y/o female here for    Essential hypertension 10/13/2016:Systolic Blood pressure is borderline.  However, since she checks blood pressure at home will continue  medication the same and will let her continue to monitor at home. As well she states the blood pressure did drop significantly with adding this Norvasc 5. Says that she had been getting systolics close to 867 and now is down to 140. Also she reports that she has started back to walking 4 miles on most days in hopes/plans to continue this. I wrote down on paper and reviewed with her that if she gets systolic readings consistently greater than 140 to call me and I can adjust blood pressure medication. Otherwise at this time will continue medication the same. Norvasc 5 mg daily. Lisinopril 20 mg daily. 04/13/2017:  Blood pressure reading high today by nurse.  Patient reports that she checks at home and the systolic is always in the 130s and 140s.  Rechecked it myself and here it clearly on the left I am getting 160/80. At this time will increase Norvasc to 10 mg daily. (She has absolutely no lower extremity edema--currently on the 5 mg Norvasc) She will return for follow-up visit in 2 weeks to recheck blood pressure.  As well she will be checking blood pressure at home some in the interim so that we can discuss those readings as well. 04/27/2017: Blood pressure is now at goal/controlled.  She has no lower extremity edema on exam.  Continue current medications.  Hyperlipidemia, unspecified hyperlipidemia type 10/13/2016: She is on simvastatin 5 mg daily. On this medication at this dose she did lipid panel 09/29/16. This shows HDL excellent at 72. LDL 137. Will continue current dose of simvastatin 5 mg. 04/13/2017: She is fasting.  Recheck lab to monitor.  She is taking the simvastatin 5 mg daily.  Age-related osteoporosis without current pathological fracture At OV 09/29/2016 --She reported that she was on Fosamax for years but that was discontinued years ago because she had been on that for the completed course. States that she had a follow-up DEXA scan performed at [place] approximately one year  ago--2017 She needs to be on calcium vitamin D and weightbearing exercise. Will check vitamin D level. - VITAMIN D 25 Hydroxy (Vit-D Deficiency, Fractures) 04/13/2017: She has completed course of Fosamax.  Vitamin D level was checked 09/29/16 and was excellent. Level was 57. She is to continue current dose of vitamin D supplements.     FOR PREVENTIVE CARE-----SEE NOTE FROM OV NOTE 09/29/2016---   04/27/2017: At today's visit we only address the hypertension.  Did not read discussed the hyperlipidemia and osteoporosis.  At this point will plan for routine follow-up visit in 6 months.  Follow-up sooner if needed.    Signed,  [address] [place], [place],  [facility] 04/27/2017 8:02 AM

## 2017-05-01 DIAGNOSIS — M9902 Segmental and somatic dysfunction of thoracic region: Secondary | ICD-10-CM | POA: Diagnosis not present

## 2017-05-01 DIAGNOSIS — M62830 Muscle spasm of back: Secondary | ICD-10-CM | POA: Diagnosis not present

## 2017-05-01 DIAGNOSIS — M9901 Segmental and somatic dysfunction of cervical region: Secondary | ICD-10-CM | POA: Diagnosis not present

## 2017-05-01 DIAGNOSIS — M9903 Segmental and somatic dysfunction of lumbar region: Secondary | ICD-10-CM | POA: Diagnosis not present

## 2017-05-15 ENCOUNTER — Other Ambulatory Visit: Payer: Self-pay

## 2017-05-15 MED ORDER — LISINOPRIL 20 MG PO TABS: 20.0000 mg | ORAL_TABLET | Freq: Every day | ORAL | 0 refills | Status: DC

## 2017-05-19 DIAGNOSIS — H401311 Pigmentary glaucoma, right eye, mild stage: Secondary | ICD-10-CM | POA: Diagnosis not present

## 2017-05-19 DIAGNOSIS — H401322 Pigmentary glaucoma, left eye, moderate stage: Secondary | ICD-10-CM | POA: Diagnosis not present

## 2017-05-22 DIAGNOSIS — M9902 Segmental and somatic dysfunction of thoracic region: Secondary | ICD-10-CM | POA: Diagnosis not present

## 2017-05-22 DIAGNOSIS — M9901 Segmental and somatic dysfunction of cervical region: Secondary | ICD-10-CM | POA: Diagnosis not present

## 2017-05-22 DIAGNOSIS — M62830 Muscle spasm of back: Secondary | ICD-10-CM | POA: Diagnosis not present

## 2017-05-22 DIAGNOSIS — M9903 Segmental and somatic dysfunction of lumbar region: Secondary | ICD-10-CM | POA: Diagnosis not present

## 2017-06-19 DIAGNOSIS — M9902 Segmental and somatic dysfunction of thoracic region: Secondary | ICD-10-CM | POA: Diagnosis not present

## 2017-06-19 DIAGNOSIS — M9903 Segmental and somatic dysfunction of lumbar region: Secondary | ICD-10-CM | POA: Diagnosis not present

## 2017-06-19 DIAGNOSIS — M62830 Muscle spasm of back: Secondary | ICD-10-CM | POA: Diagnosis not present

## 2017-06-19 DIAGNOSIS — M9901 Segmental and somatic dysfunction of cervical region: Secondary | ICD-10-CM | POA: Diagnosis not present

## 2017-06-22 DIAGNOSIS — M9902 Segmental and somatic dysfunction of thoracic region: Secondary | ICD-10-CM | POA: Diagnosis not present

## 2017-06-22 DIAGNOSIS — M9901 Segmental and somatic dysfunction of cervical region: Secondary | ICD-10-CM | POA: Diagnosis not present

## 2017-06-22 DIAGNOSIS — M62830 Muscle spasm of back: Secondary | ICD-10-CM | POA: Diagnosis not present

## 2017-06-22 DIAGNOSIS — M9903 Segmental and somatic dysfunction of lumbar region: Secondary | ICD-10-CM | POA: Diagnosis not present

## 2017-06-27 DIAGNOSIS — M9903 Segmental and somatic dysfunction of lumbar region: Secondary | ICD-10-CM | POA: Diagnosis not present

## 2017-06-27 DIAGNOSIS — M9901 Segmental and somatic dysfunction of cervical region: Secondary | ICD-10-CM | POA: Diagnosis not present

## 2017-06-27 DIAGNOSIS — M9902 Segmental and somatic dysfunction of thoracic region: Secondary | ICD-10-CM | POA: Diagnosis not present

## 2017-06-27 DIAGNOSIS — M62830 Muscle spasm of back: Secondary | ICD-10-CM | POA: Diagnosis not present

## 2017-06-30 DIAGNOSIS — H401331 Pigmentary glaucoma, bilateral, mild stage: Secondary | ICD-10-CM | POA: Diagnosis not present

## 2017-07-03 DIAGNOSIS — M9902 Segmental and somatic dysfunction of thoracic region: Secondary | ICD-10-CM | POA: Diagnosis not present

## 2017-07-03 DIAGNOSIS — M9903 Segmental and somatic dysfunction of lumbar region: Secondary | ICD-10-CM | POA: Diagnosis not present

## 2017-07-03 DIAGNOSIS — M62830 Muscle spasm of back: Secondary | ICD-10-CM | POA: Diagnosis not present

## 2017-07-03 DIAGNOSIS — M9901 Segmental and somatic dysfunction of cervical region: Secondary | ICD-10-CM | POA: Diagnosis not present

## 2017-07-06 DIAGNOSIS — M9902 Segmental and somatic dysfunction of thoracic region: Secondary | ICD-10-CM | POA: Diagnosis not present

## 2017-07-06 DIAGNOSIS — M9901 Segmental and somatic dysfunction of cervical region: Secondary | ICD-10-CM | POA: Diagnosis not present

## 2017-07-06 DIAGNOSIS — M62830 Muscle spasm of back: Secondary | ICD-10-CM | POA: Diagnosis not present

## 2017-07-06 DIAGNOSIS — M9903 Segmental and somatic dysfunction of lumbar region: Secondary | ICD-10-CM | POA: Diagnosis not present

## 2017-07-13 DIAGNOSIS — M62830 Muscle spasm of back: Secondary | ICD-10-CM | POA: Diagnosis not present

## 2017-07-13 DIAGNOSIS — M9901 Segmental and somatic dysfunction of cervical region: Secondary | ICD-10-CM | POA: Diagnosis not present

## 2017-07-13 DIAGNOSIS — M9903 Segmental and somatic dysfunction of lumbar region: Secondary | ICD-10-CM | POA: Diagnosis not present

## 2017-07-13 DIAGNOSIS — M9902 Segmental and somatic dysfunction of thoracic region: Secondary | ICD-10-CM | POA: Diagnosis not present

## 2017-07-17 DIAGNOSIS — M62830 Muscle spasm of back: Secondary | ICD-10-CM | POA: Diagnosis not present

## 2017-07-17 DIAGNOSIS — M9902 Segmental and somatic dysfunction of thoracic region: Secondary | ICD-10-CM | POA: Diagnosis not present

## 2017-07-17 DIAGNOSIS — M9903 Segmental and somatic dysfunction of lumbar region: Secondary | ICD-10-CM | POA: Diagnosis not present

## 2017-07-17 DIAGNOSIS — M9901 Segmental and somatic dysfunction of cervical region: Secondary | ICD-10-CM | POA: Diagnosis not present

## 2017-07-20 DIAGNOSIS — M9902 Segmental and somatic dysfunction of thoracic region: Secondary | ICD-10-CM | POA: Diagnosis not present

## 2017-07-20 DIAGNOSIS — M9901 Segmental and somatic dysfunction of cervical region: Secondary | ICD-10-CM | POA: Diagnosis not present

## 2017-07-20 DIAGNOSIS — M9903 Segmental and somatic dysfunction of lumbar region: Secondary | ICD-10-CM | POA: Diagnosis not present

## 2017-07-20 DIAGNOSIS — M62830 Muscle spasm of back: Secondary | ICD-10-CM | POA: Diagnosis not present

## 2017-07-24 DIAGNOSIS — M9902 Segmental and somatic dysfunction of thoracic region: Secondary | ICD-10-CM | POA: Diagnosis not present

## 2017-07-24 DIAGNOSIS — M9903 Segmental and somatic dysfunction of lumbar region: Secondary | ICD-10-CM | POA: Diagnosis not present

## 2017-07-24 DIAGNOSIS — M9901 Segmental and somatic dysfunction of cervical region: Secondary | ICD-10-CM | POA: Diagnosis not present

## 2017-07-24 DIAGNOSIS — M62830 Muscle spasm of back: Secondary | ICD-10-CM | POA: Diagnosis not present

## 2017-07-27 DIAGNOSIS — M62830 Muscle spasm of back: Secondary | ICD-10-CM | POA: Diagnosis not present

## 2017-07-27 DIAGNOSIS — M9902 Segmental and somatic dysfunction of thoracic region: Secondary | ICD-10-CM | POA: Diagnosis not present

## 2017-07-27 DIAGNOSIS — M9903 Segmental and somatic dysfunction of lumbar region: Secondary | ICD-10-CM | POA: Diagnosis not present

## 2017-07-27 DIAGNOSIS — M9901 Segmental and somatic dysfunction of cervical region: Secondary | ICD-10-CM | POA: Diagnosis not present

## 2017-07-31 DIAGNOSIS — M9902 Segmental and somatic dysfunction of thoracic region: Secondary | ICD-10-CM | POA: Diagnosis not present

## 2017-07-31 DIAGNOSIS — M9903 Segmental and somatic dysfunction of lumbar region: Secondary | ICD-10-CM | POA: Diagnosis not present

## 2017-07-31 DIAGNOSIS — M9901 Segmental and somatic dysfunction of cervical region: Secondary | ICD-10-CM | POA: Diagnosis not present

## 2017-07-31 DIAGNOSIS — M62830 Muscle spasm of back: Secondary | ICD-10-CM | POA: Diagnosis not present

## 2017-08-07 DIAGNOSIS — M9903 Segmental and somatic dysfunction of lumbar region: Secondary | ICD-10-CM | POA: Diagnosis not present

## 2017-08-07 DIAGNOSIS — M62830 Muscle spasm of back: Secondary | ICD-10-CM | POA: Diagnosis not present

## 2017-08-07 DIAGNOSIS — M9902 Segmental and somatic dysfunction of thoracic region: Secondary | ICD-10-CM | POA: Diagnosis not present

## 2017-08-07 DIAGNOSIS — M9901 Segmental and somatic dysfunction of cervical region: Secondary | ICD-10-CM | POA: Diagnosis not present

## 2017-08-14 DIAGNOSIS — M62830 Muscle spasm of back: Secondary | ICD-10-CM | POA: Diagnosis not present

## 2017-08-14 DIAGNOSIS — M9903 Segmental and somatic dysfunction of lumbar region: Secondary | ICD-10-CM | POA: Diagnosis not present

## 2017-08-14 DIAGNOSIS — M9902 Segmental and somatic dysfunction of thoracic region: Secondary | ICD-10-CM | POA: Diagnosis not present

## 2017-08-14 DIAGNOSIS — M9901 Segmental and somatic dysfunction of cervical region: Secondary | ICD-10-CM | POA: Diagnosis not present

## 2017-09-28 ENCOUNTER — Other Ambulatory Visit: Payer: Self-pay | Admitting: Physician Assistant

## 2017-11-02 ENCOUNTER — Encounter: Payer: Self-pay | Admitting: Physician Assistant

## 2017-11-02 ENCOUNTER — Ambulatory Visit (INDEPENDENT_AMBULATORY_CARE_PROVIDER_SITE_OTHER): Payer: Medicare HMO | Admitting: Physician Assistant

## 2017-11-02 VITALS — BP 136/82 | HR 58 | Temp 97.7°F | Resp 18 | Ht 60.0 in | Wt 146.0 lb

## 2017-11-02 DIAGNOSIS — E785 Hyperlipidemia, unspecified: Secondary | ICD-10-CM | POA: Diagnosis not present

## 2017-11-02 DIAGNOSIS — M810 Age-related osteoporosis without current pathological fracture: Secondary | ICD-10-CM

## 2017-11-02 DIAGNOSIS — I10 Essential (primary) hypertension: Secondary | ICD-10-CM | POA: Diagnosis not present

## 2017-11-02 LAB — COMPLETE METABOLIC PANEL WITH GFR
AG RATIO: 1.7 (calc) (ref 1.0–2.5)
ALKALINE PHOSPHATASE (APISO): 61 U/L (ref 33–130)
ALT: 14 U/L (ref 6–29)
AST: 16 U/L (ref 10–35)
Albumin: 4.3 g/dL (ref 3.6–5.1)
BILIRUBIN TOTAL: 0.4 mg/dL (ref 0.2–1.2)
BUN: 12 mg/dL (ref 7–25)
CHLORIDE: 104 mmol/L (ref 98–110)
CO2: 27 mmol/L (ref 20–32)
Calcium: 9.7 mg/dL (ref 8.6–10.4)
Creat: 0.85 mg/dL (ref 0.60–0.93)
GFR, Est African American: 77 mL/min/{1.73_m2} (ref >=60)
GFR, Est Non African American: 66 mL/min/{1.73_m2} (ref >=60)
GLOBULIN: 2.6 g/dL (ref 1.9–3.7)
Glucose, Bld: 99 mg/dL (ref 65–99)
POTASSIUM: 4.7 mmol/L (ref 3.5–5.3)
SODIUM: 139 mmol/L (ref 135–146)
Total Protein: 6.9 g/dL (ref 6.1–8.1)

## 2017-11-02 LAB — LIPID PANEL
CHOLESTEROL: 220 mg/dL — AB (ref <200)
HDL: 73 mg/dL (ref >50)
LDL Cholesterol (Calc): 132 mg/dL (calc) — ABNORMAL HIGH
NON-HDL CHOLESTEROL (CALC): 147 mg/dL — AB (ref <130)
Total CHOL/HDL Ratio: 3 (calc) (ref <5.0)
Triglycerides: 63 mg/dL (ref <150)

## 2017-11-02 NOTE — Progress Notes (Signed)
Patient ID: Jillian Koch MRN: [id number], DOB: [birth year], 77 y.o. Date of Encounter: 11/02/2017,   Chief Complaint: Hypertension  HPI: 77 y.o. y/o female   09/29/2016:  here for CPE.   She reports that she has been seeing Dr. Maudie Mercury in [place]. At [facility]--located on [address]. She reports that she has 2 sisters and her brother-in-law who come here to this office. States that this location is better for her.  She reports that her last routine visit with the prior PCP was February 2018 and that her next visit there was scheduled for next Friday but she is canceling that.  States that that appointment for next week was for a complete physical exam and has been one year since last physical. She is fasting today.  She reports that she checks her blood pressure at home and gets high readings. She is taking the lisinopril daily as directed. Even with taking this medication when she checks at home it is getting systolic around 782 and says that it reads high at home-- as well as at our office today.  Discussed that her history also documents osteoporosis. She states that she "was on Fosamax for years but that was years ago". Says "can't be on that but for so long ". Sounds like she completed the 5 year course of therapy for that. Reports that she had bone density scan about one year ago. Says that was performed in Dr. Julianne Rice office building at [facility].  She is married and lives with her husband. Asked if they had a garden. She says that "it didn't do too much this year-- the tomatoes came out small ".  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  AT THAT VISIT: UPDATED PREVENTIVE CARE DID MEDICARE PHYSICAL ADDED NORVASC 5mg  QD F/U OV 2 WEEKS   10/13/2016: Today she reports that she did add the Norvasc 5 mg and  has been taking this daily. She is having no adverse effects. She reports that she has been checking her blood pressure at home some. Says that she is getting similar readings as well we got today with systolic right around 956. Says that she and her neighbor has started walking 4 miles per day. Says that she had done this in the past but then had slacked off. Says that she doesn't walk every single day but will try to continue walking.   04/13/2017: Today she reports that she is taking both the Norvasc and the lisinopril as directed.  Having no lightheadedness or other adverse effects. She also reports that she continues to take the Zocor daily.  Having no myalgias or other adverse effects with this. Reports that she was sick with a cold over the past week but that is resolving and she is feeling better now.   Overall she has been doing well up until that illness.   However states that she has not been doing her walking.   Discussed that was so much rain over the past year it is difficult to have been doing much walking.   She says that she does go to [place] and walk there some.  Says that now that she is feeling better may be she needs to get back to doing that.   04/27/2017: At her visit 04/13/2017 nurse had blood pressure reading 150/78  and when I rechecked I got 160/80.  At that visit she had absolutely no lower extremity edema on exam and had been taking Norvasc 5 mg up to that point.  At that visit increased Norvasc to 10 mg daily. Today she reports that she has been taking the Norvasc at 10 mg daily.  She has had no lower extremity edema.  Has been checking blood pressure at home and is getting systolic in the 938B.   11/02/2017: She is taking her lisinopril and amlodipine.  These are causing no lightheadedness or other adverse effects.  Her blood pressure readings have been good at home. She is taking simvastatin.  This is causing no myalgias or other adverse effects. States she has not  been doing her walking routinely recently because has had other things going on "also the weather has been hot but is getting ready starting to cool off so is hoping to walk outside some.  Says that she and her friend sometimes go and walk around [place] (?sp).  States that she does sometimes go and walk through [place] or [employer]. States that things have been stable.  She has no specific concerns to address today.   Review of Systems: Consitutional: No fever, chills, fatigue, night sweats, lymphadenopathy. No significant/unexplained weight changes. Eyes: No visual changes, eye redness, or discharge. ENT/Mouth: No ear pain, sore throat, nasal drainage, or sinus pain. Cardiovascular: No chest pressure,heaviness, tightness or squeezing, even with exertion. No increased shortness of breath or dyspnea on exertion.No palpitations, edema, orthopnea, PND. Respiratory: No cough, hemoptysis, SOB, or wheezing. Gastrointestinal: No anorexia, dysphagia, reflux, pain, nausea, vomiting, hematemesis, diarrhea, constipation, BRBPR, or melena. Breast: No mass, nodules, bulging, or retraction. No skin changes or inflammation. No nipple discharge. No lymphadenopathy. Genitourinary: No dysuria, hematuria, incontinence, vaginal discharge, pruritis, burning, abnormal bleeding, or pain. Musculoskeletal: No decreased ROM, No joint pain or swelling. No significant pain in neck, back, or extremities. Skin: No rash, pruritis, or concerning lesions. Neurological: No headache, dizziness, syncope, seizures, tremors, memory loss, coordination problems, or paresthesias. Psychological: No anxiety, depression, hallucinations, SI/HI. Endocrine: No polydipsia, polyphagia, polyuria, or known diabetes.No increased fatigue. No palpitations/rapid heart rate. No significant/unexplained weight change. All other systems were reviewed and are otherwise negative.  Past Medical History:  Diagnosis Date  . Hypertension       Past Surgical History:  Procedure Laterality Date  . ABDOMINAL HYSTERECTOMY    . APPENDECTOMY      Home Meds:  Outpatient Medications Prior to Visit  Medication Sig Dispense Refill  . amLODipine (NORVASC) 10 MG tablet Take 1 tablet (10 mg total) by mouth daily. 90 tablet 2  . fish oil-omega-3 fatty acids 1000 MG capsule Take 1,200 mg by mouth daily.    Marland Kitchen lisinopril (PRINIVIL,ZESTRIL) 20 MG tablet TAKE 1 TABLET BY MOUTH ONCE DAILY 90 tablet 0  . simvastatin (ZOCOR) 5 MG tablet Take 5 mg by mouth at bedtime.     No facility-administered medications prior to visit.     Allergies: No Known Allergies  Social History   Socioeconomic History  . Marital status: Married    Spouse name: Not on file  . Number of children: Not on file  . Years of education: Not on file  . Highest education level: Not on file  Occupational History  . Not on file  Social Needs  . Financial resource strain: Not on file  . Food insecurity:    Worry: Not on file    Inability: Not on  file  . Transportation needs:    Medical: Not on file    Non-medical: Not on file  Tobacco Use  . Smoking status: Never Smoker  . Smokeless tobacco: Never Used  Substance and Sexual Activity  . Alcohol use: Not on file  . Drug use: Not on file  . Sexual activity: Not on file  Lifestyle  . Physical activity:    Days per week: Not on file    Minutes per session: Not on file  . Stress: Not on file  Relationships  . Social connections:    Talks on phone: Not on file    Gets together: Not on file    Attends religious service: Not on file    Active member of club or organization: Not on file    Attends meetings of clubs or organizations: Not on file    Relationship status: Not on file  . Intimate partner violence:    Fear of current or ex partner: Not on file    Emotionally abused: Not on file    Physically abused: Not on file    Forced sexual activity: Not on file  Other Topics Concern  . Not on file    Social History Narrative  . Not on file    Family History  Problem Relation Age of Onset  . Colon cancer Maternal Aunt   . Breast cancer Sister     Physical Exam: Blood pressure 136/82, pulse (!) 58, temperature 97.7 F (36.5 C), temperature source Oral, resp. rate 18, height 5' (1.524 m), weight 66.2 kg, SpO2 99 %., Body mass index is 28.51 kg/m. General: WNWD WF. Appears in no acute distress. Neck: Supple. No thyromegaly. No lymphadenopathy. No carotid bruits. Lungs: Clear bilaterally to auscultation without wheezes, rales, or rhonchi. Breathing is unlabored. Heart: RRR with S1 S2. No murmurs, rubs, or gallops. Abdomen: Soft, non-tender, non-distended with normoactive bowel sounds. No hepatomegaly. No rebound/guarding. No obvious abdominal masses. Musculoskeletal:  Strength and tone normal for age. Extremities/Skin: Warm and dry.  No LE edema.  Neuro: Alert and oriented X 3. Moves all extremities spontaneously. Gait is normal. CNII-XII grossly in tact. Psych:  Responds to questions appropriately with a normal affect.   Assessment/Plan:  77 y.o. y/o female here for    Essential hypertension 10/13/2016:Systolic Blood pressure is borderline.  However, since she checks blood pressure at home will continue medication the same and will let her continue to monitor at home. As well she states the blood pressure did drop significantly with adding this Norvasc 5. Says that she had been getting systolics close to 035 and now is down to 140. Also she reports that she has started back to walking 4 miles on most days in hopes/plans to continue this. I wrote down on paper and reviewed with her that if she gets systolic readings consistently greater than 140 to call me and I can adjust blood pressure medication. Otherwise at this time will continue medication the same. Norvasc 5 mg daily. Lisinopril 20 mg daily. 04/13/2017:  Blood pressure reading high today by nurse.  Patient reports that she  checks at home and the systolic is always in the 130s and 140s.  Rechecked it myself and here it clearly on the left I am getting 160/80. At this time will increase Norvasc to 10 mg daily. (She has absolutely no lower extremity edema--currently on the 5 mg Norvasc) She will return for follow-up visit in 2 weeks to recheck blood pressure.  As well she  will be checking blood pressure at home some in the interim so that we can discuss those readings as well. 04/27/2017: Blood pressure is now at goal/controlled.  She has no lower extremity edema on exam.  Continue current medications. 11/02/2017: Blood Pressure is controlled, at goal.  Continue current meds the same.  Check lab to monitor.   Hyperlipidemia, unspecified hyperlipidemia type 10/13/2016: She is on simvastatin 5 mg daily. On this medication at this dose she did lipid panel 09/29/16. This shows HDL excellent at 72. LDL 137. Will continue current dose of simvastatin 5 mg. 04/13/2017: She is fasting.  Recheck lab to monitor.  She is taking the simvastatin 5 mg daily. 11/02/2017: She is on simvastatin 5 mg daily.  She is fasting.  Check lab to monitor.    Age-related osteoporosis without current pathological fracture At OV 09/29/2016 --She reported that she was on Fosamax for years but that was discontinued years ago because she had been on that for the completed course. States that she had a follow-up DEXA scan performed at [place] approximately one year ago--2017 She needs to be on calcium vitamin D and weightbearing exercise. Will check vitamin D level. - VITAMIN D 25 Hydroxy (Vit-D Deficiency, Fractures) 11/02/2017: She has completed course of Fosamax.  Vitamin D level was checked 09/29/16 and was excellent. Level was 57. She is to continue current dose of vitamin D supplements.   11/02/2017: I discussed, recommended flu vaccine today but she defers.  She responds "I never get that."  FOR PREVENTIVE CARE-----SEE NOTE FROM OV NOTE  09/29/2016---      Signed, Olean Ree [place], [place], [facility] 11/02/2017 8:07 AM

## 2017-12-04 DIAGNOSIS — L570 Actinic keratosis: Secondary | ICD-10-CM | POA: Diagnosis not present

## 2017-12-04 DIAGNOSIS — D1801 Hemangioma of skin and subcutaneous tissue: Secondary | ICD-10-CM | POA: Diagnosis not present

## 2017-12-04 DIAGNOSIS — L821 Other seborrheic keratosis: Secondary | ICD-10-CM | POA: Diagnosis not present

## 2018-01-08 DIAGNOSIS — R69 Illness, unspecified: Secondary | ICD-10-CM | POA: Diagnosis not present

## 2018-01-09 DIAGNOSIS — H04123 Dry eye syndrome of bilateral lacrimal glands: Secondary | ICD-10-CM | POA: Diagnosis not present

## 2018-01-09 DIAGNOSIS — H524 Presbyopia: Secondary | ICD-10-CM | POA: Diagnosis not present

## 2018-01-09 DIAGNOSIS — H401322 Pigmentary glaucoma, left eye, moderate stage: Secondary | ICD-10-CM | POA: Diagnosis not present

## 2018-01-09 DIAGNOSIS — H401311 Pigmentary glaucoma, right eye, mild stage: Secondary | ICD-10-CM | POA: Diagnosis not present

## 2018-01-16 ENCOUNTER — Other Ambulatory Visit: Payer: Self-pay | Admitting: Family Medicine

## 2018-01-16 MED ORDER — LISINOPRIL 20 MG PO TABS: 20.0000 mg | ORAL_TABLET | Freq: Every day | ORAL | 0 refills | Status: DC

## 2018-01-30 DIAGNOSIS — I10 Essential (primary) hypertension: Secondary | ICD-10-CM | POA: Diagnosis not present

## 2018-01-30 DIAGNOSIS — Z6825 Body mass index (BMI) 25.0-25.9, adult: Secondary | ICD-10-CM | POA: Diagnosis not present

## 2018-01-30 DIAGNOSIS — M542 Cervicalgia: Secondary | ICD-10-CM | POA: Diagnosis not present

## 2018-02-05 ENCOUNTER — Other Ambulatory Visit: Payer: Self-pay

## 2018-02-05 ENCOUNTER — Encounter (HOSPITAL_COMMUNITY): Payer: Self-pay | Admitting: Emergency Medicine

## 2018-02-05 ENCOUNTER — Emergency Department (HOSPITAL_COMMUNITY)
Admission: EM | Admit: 2018-02-05 | Discharge: 2018-02-05 | Disposition: A | Discharge status: Home / Self Care | Payer: Medicare HMO | Attending: Emergency Medicine | Admitting: Emergency Medicine

## 2018-02-05 ENCOUNTER — Emergency Department (HOSPITAL_COMMUNITY): Payer: Medicare HMO

## 2018-02-05 DIAGNOSIS — J069 Acute upper respiratory infection, unspecified: Secondary | ICD-10-CM | POA: Insufficient documentation

## 2018-02-05 DIAGNOSIS — I10 Essential (primary) hypertension: Secondary | ICD-10-CM | POA: Insufficient documentation

## 2018-02-05 DIAGNOSIS — J029 Acute pharyngitis, unspecified: Secondary | ICD-10-CM | POA: Diagnosis present

## 2018-02-05 DIAGNOSIS — Z79899 Other long term (current) drug therapy: Secondary | ICD-10-CM | POA: Diagnosis not present

## 2018-02-05 DIAGNOSIS — R05 Cough: Secondary | ICD-10-CM | POA: Diagnosis not present

## 2018-02-05 DIAGNOSIS — B9789 Other viral agents as the cause of diseases classified elsewhere: Secondary | ICD-10-CM | POA: Diagnosis not present

## 2018-02-05 LAB — INFLUENZA PANEL BY PCR (TYPE A & B)
Influenza A By PCR: NEGATIVE
Influenza B By PCR: NEGATIVE

## 2018-02-05 MED ORDER — BENZONATATE 100 MG PO CAPS: 100.0000 mg | ORAL_CAPSULE | Freq: Three times a day (TID) | ORAL | 0 refills | Status: AC

## 2018-02-05 MED ORDER — IBUPROFEN 400 MG PO TABS
600.0000 mg | ORAL_TABLET | Freq: Once | ORAL | Status: AC
  Administered 2018-02-05: 600 mg via ORAL
  Filled 2018-02-05: qty 2

## 2018-02-05 NOTE — Discharge Instructions (Addendum)
If you were given a prescription, please take the prescription as you were instructed and follow the directions given on the discharge paperwork.   Over the next several days you should rest as much as possible, and drink more fluids than usual. Liquids will help thin and loosen mucus so you can cough it up. Liquids will also help prevent dehydration. Using a cool mist humidifier or a vaporizer to increase air moisture in your home can also make it easier for you to breathe and help decrease your cough.  To help soothe a sore throat gargle with warm salt water.  Make salt water by dissolving  teaspoon salt in 1 cup warm water. You may also use throat lozenges and over the counter sore throat spray.  Please follow up with your primary care provider within 3-5 days for re-evaluation of your symptoms. Please return to the emergency department for any persistent fevers, worsening sore throat/hoarse voice, inability to swallow, persistent vomiting, chest pain, shortness of breath, coughing up blood, or any new or worsening symptoms.

## 2018-02-05 NOTE — ED Triage Notes (Signed)
Patient reports sore throat that started on Friday, cough that started today.

## 2018-02-05 NOTE — ED Provider Notes (Signed)
[facility] EMERGENCY DEPARTMENT Provider Note   CSN: [id number] Arrival date & time: 02/05/18  1546     History   Chief Complaint Chief Complaint  Patient presents with  . Sore Throat    HPI Jillian Koch is a 77 y.o. female.  HPI   Patient is a 77 year old female with a history of hypertension, who presents emergency department today for evaluation of a sore throat that began 3 days ago.  She also began to have a cough today that is productive of white sputum.  She denies any shortness of breath, chest pain, rhinorrhea, congestion.  She has had some body aches, sweats and chills.  Denies abdominal pain, nausea, vomiting, diarrhea or urinary symptoms.  Symptoms have been constant in nature.  Denies exacerbating or alleviating factors to her symptoms.  Past Medical History:  Diagnosis Date  . Hypertension     Patient Active Problem List   Diagnosis Date Noted  . Hypertension 09/29/2016  . Osteoporosis 06/22/2007  . Hyperlipidemia 01/29/2007  . HEMORRHOIDS, INTERNAL 12/18/2006  . ARTHRITIS 12/18/2006    Past Surgical History:  Procedure Laterality Date  . ABDOMINAL HYSTERECTOMY    . APPENDECTOMY       OB History    Gravida      Para      Term      Preterm      AB      Living  0     SAB      TAB      Ectopic      Multiple      Live Births               Home Medications    Prior to Admission medications   Medication Sig Start Date End Date Taking? Authorizing Provider  amLODipine (NORVASC) 10 MG tablet Take 1 tablet (10 mg total) by mouth daily. 04/27/17 04/27/18  Orlena Sheldon, PA-C  benzonatate (TESSALON) 100 MG capsule Take 1 capsule (100 mg total) by mouth every 8 (eight) hours for 5 days. 02/05/18 02/10/18  Talaysia Pinheiro S, PA-C  fish oil-omega-3 fatty acids 1000 MG capsule Take 1,200 mg by mouth daily.    [provider]  lisinopril (PRINIVIL,ZESTRIL) 20 MG tablet Take 1 tablet (20 mg total) by mouth daily. 01/16/18    Susy Frizzle, MD  simvastatin (ZOCOR) 5 MG tablet Take 5 mg by mouth at bedtime. 08/26/16   [provider]    Family History Family History  Problem Relation Age of Onset  . Colon cancer Maternal Aunt   . Breast cancer Sister     Social History Social History   Tobacco Use  . Smoking status: Never Smoker  . Smokeless tobacco: Never Used  Substance Use Topics  . Alcohol use: Never    Frequency: Never  . Drug use: Never     Allergies   Penicillins   Review of Systems Review of Systems  Constitutional: Positive for chills and diaphoresis.  HENT: Positive for sore throat. Negative for congestion, ear pain and rhinorrhea.   Eyes: Negative for pain and visual disturbance.  Respiratory: Positive for cough. Negative for shortness of breath.   Cardiovascular: Negative for chest pain.  Gastrointestinal: Negative for abdominal pain, diarrhea, nausea and vomiting.  Genitourinary: Negative for dysuria and hematuria.  Musculoskeletal: Positive for myalgias. Negative for back pain.  Skin: Negative for rash.  Neurological: Negative for headaches.  All other systems reviewed and are negative.  Physical Exam  Updated Vital Signs BP (!) 167/76 (BP Location: Right Arm)   Pulse 86   Temp 99 F (37.2 C) (Oral)   Resp 20   Ht 5\' 2"  (1.575 m)   Wt 65.8 kg   SpO2 96%   BMI 26.52 kg/m   Physical Exam Vitals signs and nursing note reviewed.  Constitutional:      General: She is not in acute distress.    Appearance: She is well-developed.     Comments: Appears younger than stated age.  HENT:     Head: Normocephalic and atraumatic.     Nose: No congestion or rhinorrhea.     Mouth/Throat:     Mouth: Mucous membranes are moist.     Comments: Mild pharyngeal erythema.  No tonsillar swelling or exudates. Eyes:     Conjunctiva/sclera: Conjunctivae normal.  Neck:     Musculoskeletal: Neck supple.  Cardiovascular:     Rate and Rhythm: Normal rate and regular rhythm.       Heart sounds: Normal heart sounds. No murmur.  Pulmonary:     Effort: Pulmonary effort is normal. No respiratory distress.     Breath sounds: Normal breath sounds. No stridor. No wheezing or rhonchi.  Abdominal:     General: Bowel sounds are normal.     Palpations: Abdomen is soft.     Tenderness: There is no abdominal tenderness.  Skin:    General: Skin is warm and dry.     Capillary Refill: Capillary refill takes less than 2 seconds.  Neurological:     Mental Status: She is alert.  Psychiatric:        Mood and Affect: Mood normal.      ED Treatments / Results  Labs (all labs ordered are listed, but only abnormal results are displayed) Labs Reviewed  INFLUENZA PANEL BY PCR (TYPE A & B)    EKG None  Radiology Dg Chest 2 View  Result Date: 02/05/2018 CLINICAL DATA:  Cough since Friday, sore throat EXAM: CHEST - 2 VIEW COMPARISON:  None available FINDINGS: Upper normal heart size. Mediastinal contours and pulmonary vascularity normal. Emphysematous changes with minimal atelectasis or scarring at LEFT base. Remaining lungs clear. No pleural effusion or pneumothorax. Bones demineralized. IMPRESSION: Emphysematous changes with minimal atelectasis versus scarring at LEFT base. Electronically Signed   By: Lavonia Dana M.D.   On: 02/05/2018 17:31    Procedures Procedures (including critical care time)  Medications Ordered in ED Medications  ibuprofen (ADVIL,MOTRIN) tablet 600 mg (600 mg Oral Given 02/05/18 1953)     Initial Impression / Assessment and Plan / ED Course  I have reviewed the triage vital signs and the nursing notes.  Pertinent labs & imaging results that were available during my care of the patient were reviewed by me and considered in my medical decision making (see chart for details).  Case discussed with Dr. Roderic Palau who is in agreement with the plan.  Final Clinical Impressions(s) / ED Diagnoses   Final diagnoses:  Viral URI with cough   Patient  presenting with sore throat and mild cough beginning several days ago.  Has had body aches sweats and chills at home but no documented fevers at home and no fever here.  Blood pressure is elevated but otherwise vital signs are within normal limits.  Patient is very well-appearing on exam.  Will obtain chest x-ray and flu swab. CENTOR score 0 therefore strep test not indicated.  CXR negative for pneumonia.  Influenza testing is negative.  Suspect viral syndrome.  Have advised patient to treat symptoms with supportive therapy such as Tylenol, Motrin, and benzonatate.  Have advised her to follow-up with her PCP later this week for reevaluation and to return to the ER for new or worsening symptoms in the meantime.  She and her husband voiced an understanding the plan and reasons to return to the ED.  All questions answered.   ED Discharge Orders         Ordered    benzonatate (TESSALON) 100 MG capsule  Every 8 hours     02/05/18 2013           Bishop Dublin 02/05/18 2016    Milton Ferguson, MD 02/05/18 2255

## 2018-02-05 NOTE — ED Notes (Signed)
Patient given discharge instruction, verbalized understand. Patient ambulatory out of the department.  

## 2018-03-01 DIAGNOSIS — M542 Cervicalgia: Secondary | ICD-10-CM | POA: Diagnosis not present

## 2018-03-15 DIAGNOSIS — M5412 Radiculopathy, cervical region: Secondary | ICD-10-CM | POA: Diagnosis not present

## 2018-03-15 DIAGNOSIS — Z6825 Body mass index (BMI) 25.0-25.9, adult: Secondary | ICD-10-CM | POA: Diagnosis not present

## 2018-03-15 DIAGNOSIS — M47812 Spondylosis without myelopathy or radiculopathy, cervical region: Secondary | ICD-10-CM | POA: Diagnosis not present

## 2018-03-15 DIAGNOSIS — M4802 Spinal stenosis, cervical region: Secondary | ICD-10-CM | POA: Diagnosis not present

## 2018-03-15 DIAGNOSIS — I10 Essential (primary) hypertension: Secondary | ICD-10-CM | POA: Diagnosis not present

## 2018-03-21 DIAGNOSIS — Z6825 Body mass index (BMI) 25.0-25.9, adult: Secondary | ICD-10-CM | POA: Diagnosis not present

## 2018-03-21 DIAGNOSIS — M4802 Spinal stenosis, cervical region: Secondary | ICD-10-CM | POA: Diagnosis not present

## 2018-03-21 DIAGNOSIS — M5412 Radiculopathy, cervical region: Secondary | ICD-10-CM | POA: Diagnosis not present

## 2018-03-21 DIAGNOSIS — I10 Essential (primary) hypertension: Secondary | ICD-10-CM | POA: Diagnosis not present

## 2018-03-21 DIAGNOSIS — M47812 Spondylosis without myelopathy or radiculopathy, cervical region: Secondary | ICD-10-CM | POA: Diagnosis not present

## 2018-05-03 ENCOUNTER — Ambulatory Visit: Payer: Medicare HMO | Admitting: Physician Assistant

## 2018-05-03 ENCOUNTER — Other Ambulatory Visit: Payer: Self-pay

## 2018-05-03 ENCOUNTER — Encounter: Payer: Self-pay | Admitting: Family Medicine

## 2018-05-03 ENCOUNTER — Ambulatory Visit (INDEPENDENT_AMBULATORY_CARE_PROVIDER_SITE_OTHER): Payer: Medicare HMO | Admitting: Family Medicine

## 2018-05-03 VITALS — BP 156/88 | HR 70 | Temp 97.7°F | Resp 16 | Ht 60.0 in | Wt 144.0 lb

## 2018-05-03 DIAGNOSIS — E785 Hyperlipidemia, unspecified: Secondary | ICD-10-CM | POA: Diagnosis not present

## 2018-05-03 DIAGNOSIS — Z1239 Encounter for other screening for malignant neoplasm of breast: Secondary | ICD-10-CM

## 2018-05-03 DIAGNOSIS — Z23 Encounter for immunization: Secondary | ICD-10-CM

## 2018-05-03 DIAGNOSIS — I10 Essential (primary) hypertension: Secondary | ICD-10-CM

## 2018-05-03 NOTE — Progress Notes (Signed)
Subjective:    Patient ID: Jillian Koch, female    DOB: [birth year], 78 y.o.   MRN: [id number]  HPI  Patient is a very pleasant 78 year old Caucasian female here today for a routine checkup.  Blood pressure is elevated at 156/88.  She states she is compliant with her blood pressure medication.  She denies any chest pain shortness of breath or dyspnea on exertion.  She has not been checking her blood pressure at home.  She ran out of simvastatin several months ago.  She denies any myalgias or right upper quadrant pain.  She is overdue for mammogram.  She was still want to treat breast cancer if it was found and therefore I recommended a mammogram at her earliest convenience.  She is also overdue for Prevnar 13.  Pneumovax 23 was given in 2008.  Past Medical History:  Diagnosis Date  . Hypertension    Past Surgical History:  Procedure Laterality Date  . ABDOMINAL HYSTERECTOMY    . APPENDECTOMY     Current Outpatient Medications on File Prior to Visit  Medication Sig Dispense Refill  . amLODipine (NORVASC) 10 MG tablet Take 1 tablet (10 mg total) by mouth daily. 90 tablet 2  . fish oil-omega-3 fatty acids 1000 MG capsule Take 1,200 mg by mouth daily.    . simvastatin (ZOCOR) 5 MG tablet Take 5 mg by mouth at bedtime.     No current facility-administered medications on file prior to visit.    Allergies  Allergen Reactions  . Penicillins Rash   Social History   Socioeconomic History  . Marital status: Married    Spouse name: Not on file  . Number of children: Not on file  . Years of education: Not on file  . Highest education level: Not on file  Occupational History  . Not on file  Social Needs  . Financial resource strain: Not on file  . Food insecurity:    Worry: Not on file    Inability: Not on file  . Transportation needs:    Medical: Not on file    Non-medical: Not on file  Tobacco Use  . Smoking status: Never Smoker  . Smokeless tobacco: Never Used  Substance  and Sexual Activity  . Alcohol use: Never    Frequency: Never  . Drug use: Never  . Sexual activity: Not on file  Lifestyle  . Physical activity:    Days per week: Not on file    Minutes per session: Not on file  . Stress: Not on file  Relationships  . Social connections:    Talks on phone: Not on file    Gets together: Not on file    Attends religious service: Not on file    Active member of club or organization: Not on file    Attends meetings of clubs or organizations: Not on file    Relationship status: Not on file  . Intimate partner violence:    Fear of current or ex partner: Not on file    Emotionally abused: Not on file    Physically abused: Not on file    Forced sexual activity: Not on file  Other Topics Concern  . Not on file  Social History Narrative  . Not on file    Review of Systems  All other systems reviewed and are negative.      Objective:   Physical Exam Vitals signs reviewed.  Constitutional:      Appearance: Normal appearance.  Cardiovascular:  Rate and Rhythm: Normal rate and regular rhythm.     Heart sounds: Normal heart sounds. No murmur. No friction rub. No gallop.   Pulmonary:     Effort: Pulmonary effort is normal. No respiratory distress.     Breath sounds: Normal breath sounds. No wheezing, rhonchi or rales.  Abdominal:     General: Bowel sounds are normal. There is no distension.     Palpations: Abdomen is soft.     Tenderness: There is no abdominal tenderness. There is no guarding.  Musculoskeletal:     Right lower leg: No edema.     Left lower leg: No edema.  Neurological:     Mental Status: She is alert.           Assessment & Plan:  Essential hypertension - Plan: CBC with Differential/Platelet, COMPLETE METABOLIC PANEL WITH GFR, Lipid panel  Hyperlipidemia, unspecified hyperlipidemia type - Plan: CBC with Differential/Platelet, COMPLETE METABOLIC PANEL WITH GFR, Lipid panel  Breast cancer screening - Plan: MM  Digital Screening  Blood pressure is elevated.  Have asked the patient to check her blood pressure in the morning and night for the next for 5 days and record the values.  She is to call me first of next week and if consistently elevated, I would add additional medication to bring down her blood pressure such as hydrochlorothiazide.  I will check a fasting lipid panel.  Ideally I like her LDL cholesterol well below 130.  We may need to increase simvastatin or switch to Crestor if her LDL cholesterol is elevated.  I will schedule the patient for mammogram to screen for breast cancer.  Patient received Prevnar 13 today in the office.  The remainder of her physical exam is normal.

## 2018-05-03 NOTE — Addendum Note (Signed)
Addended by: Shary Decamp B on: 05/03/2018 02:35 PM   Modules accepted: Orders

## 2018-05-04 ENCOUNTER — Other Ambulatory Visit: Payer: Self-pay

## 2018-05-04 LAB — CBC WITH DIFFERENTIAL/PLATELET
Absolute Monocytes: 706 cells/uL (ref 200–950)
Basophils Absolute: 63 cells/uL (ref 0–200)
Basophils Relative: 1 %
Eosinophils Absolute: 113 cells/uL (ref 15–500)
Eosinophils Relative: 1.8 %
HEMATOCRIT: 38.2 % (ref 35.0–45.0)
Hemoglobin: 13.1 g/dL (ref 11.7–15.5)
LYMPHS ABS: 2703 {cells}/uL (ref 850–3900)
MCH: 31.6 pg (ref 27.0–33.0)
MCHC: 34.3 g/dL (ref 32.0–36.0)
MCV: 92.3 fL (ref 80.0–100.0)
MPV: 9.6 fL (ref 7.5–12.5)
Monocytes Relative: 11.2 %
NEUTROS PCT: 43.1 %
Neutro Abs: 2715 cells/uL (ref 1500–7800)
PLATELETS: 298 10*3/uL (ref 140–400)
RBC: 4.14 10*6/uL (ref 3.80–5.10)
RDW: 12.6 % (ref 11.0–15.0)
TOTAL LYMPHOCYTE: 42.9 %
WBC: 6.3 10*3/uL (ref 3.8–10.8)

## 2018-05-04 LAB — COMPLETE METABOLIC PANEL WITH GFR
AG Ratio: 1.7 (calc) (ref 1.0–2.5)
ALBUMIN MSPROF: 4.4 g/dL (ref 3.6–5.1)
ALT: 20 U/L (ref 6–29)
AST: 19 U/L (ref 10–35)
Alkaline phosphatase (APISO): 69 U/L (ref 37–153)
BUN: 23 mg/dL (ref 7–25)
CO2: 25 mmol/L (ref 20–32)
CREATININE: 0.76 mg/dL (ref 0.60–0.93)
Calcium: 9.7 mg/dL (ref 8.6–10.4)
Chloride: 105 mmol/L (ref 98–110)
GFR, Est African American: 88 mL/min/{1.73_m2} (ref >=60)
GFR, Est Non African American: 76 mL/min/{1.73_m2} (ref >=60)
Globulin: 2.6 g/dL (calc) (ref 1.9–3.7)
Glucose, Bld: 103 mg/dL — ABNORMAL HIGH (ref 65–99)
Potassium: 5.4 mmol/L — ABNORMAL HIGH (ref 3.5–5.3)
Sodium: 140 mmol/L (ref 135–146)
Total Bilirubin: 0.4 mg/dL (ref 0.2–1.2)
Total Protein: 7 g/dL (ref 6.1–8.1)

## 2018-05-04 LAB — LIPID PANEL
CHOL/HDL RATIO: 4.1 (calc) (ref <5.0)
Cholesterol: 265 mg/dL — ABNORMAL HIGH (ref <200)
HDL: 65 mg/dL (ref >=50)
LDL CHOLESTEROL (CALC): 180 mg/dL — AB
Non-HDL Cholesterol (Calc): 200 mg/dL (calc) — ABNORMAL HIGH (ref <130)
Triglycerides: 89 mg/dL (ref <150)

## 2018-05-04 MED ORDER — ROSUVASTATIN CALCIUM 10 MG PO TABS: 10.0000 mg | ORAL_TABLET | Freq: Every day | ORAL | 1 refills | Status: DC

## 2018-05-07 ENCOUNTER — Telehealth: Payer: Self-pay | Admitting: Family Medicine

## 2018-05-07 ENCOUNTER — Other Ambulatory Visit: Payer: Self-pay | Admitting: Family Medicine

## 2018-05-07 MED ORDER — HYDROCHLOROTHIAZIDE 25 MG PO TABS: 25.0000 mg | ORAL_TABLET | Freq: Every day | ORAL | 3 refills | Status: DC

## 2018-05-07 NOTE — Telephone Encounter (Signed)
Patient calling to say that her blood pressure is still elevated this morning would like a call back at [phone]

## 2018-05-07 NOTE — Telephone Encounter (Signed)
Start hctz 25 mg poqday and recheck bp in 2 weeks.

## 2018-05-08 NOTE — Telephone Encounter (Signed)
[facility] with Husband

## 2018-05-09 NOTE — Telephone Encounter (Signed)
Patient aware of providers recommendations.  

## 2018-06-06 ENCOUNTER — Telehealth: Payer: Self-pay | Admitting: Family Medicine

## 2018-06-06 DIAGNOSIS — I10 Essential (primary) hypertension: Secondary | ICD-10-CM

## 2018-06-06 NOTE — Telephone Encounter (Signed)
Pt called with BP readings - 143/83,163/73,158/73,154/71,143/73,163/80. Pt is only taking HCTZ as she ran out of the amlodipine and no one told her to continue taking it so she stopped it. (she has not taken the amlodipine for a while)

## 2018-06-07 MED ORDER — AMLODIPINE BESYLATE 10 MG PO TABS: 10.0000 mg | ORAL_TABLET | Freq: Every day | ORAL | 2 refills | Status: DC

## 2018-06-07 NOTE — Telephone Encounter (Signed)
Resume amlodipine 10 mg poqday

## 2018-06-07 NOTE — Telephone Encounter (Signed)
Pt aware via vm and med sent to [facility]

## 2018-06-22 DIAGNOSIS — H401322 Pigmentary glaucoma, left eye, moderate stage: Secondary | ICD-10-CM | POA: Diagnosis not present

## 2018-06-22 DIAGNOSIS — H401311 Pigmentary glaucoma, right eye, mild stage: Secondary | ICD-10-CM | POA: Diagnosis not present

## 2018-08-31 ENCOUNTER — Other Ambulatory Visit: Payer: Self-pay

## 2018-08-31 ENCOUNTER — Ambulatory Visit
Admission: RE | Admit: 2018-08-31 | Discharge: 2018-08-31 | Disposition: A | Discharge status: Home / Self Care | Payer: Medicare HMO | Source: Ambulatory Visit | Attending: Family Medicine | Admitting: Family Medicine

## 2018-08-31 DIAGNOSIS — Z1231 Encounter for screening mammogram for malignant neoplasm of breast: Secondary | ICD-10-CM | POA: Diagnosis not present

## 2018-08-31 DIAGNOSIS — Z1239 Encounter for other screening for malignant neoplasm of breast: Secondary | ICD-10-CM

## 2018-11-02 ENCOUNTER — Other Ambulatory Visit: Payer: Self-pay

## 2018-11-02 DIAGNOSIS — Z20822 Contact with and (suspected) exposure to covid-19: Secondary | ICD-10-CM

## 2018-11-02 DIAGNOSIS — R6889 Other general symptoms and signs: Secondary | ICD-10-CM | POA: Diagnosis not present

## 2018-11-03 LAB — NOVEL CORONAVIRUS, NAA: SARS-CoV-2, NAA: DETECTED — AB

## 2018-11-04 ENCOUNTER — Other Ambulatory Visit: Payer: Self-pay | Admitting: Family Medicine

## 2018-12-10 DIAGNOSIS — D1801 Hemangioma of skin and subcutaneous tissue: Secondary | ICD-10-CM | POA: Diagnosis not present

## 2018-12-10 DIAGNOSIS — L570 Actinic keratosis: Secondary | ICD-10-CM | POA: Diagnosis not present

## 2018-12-10 DIAGNOSIS — L821 Other seborrheic keratosis: Secondary | ICD-10-CM | POA: Diagnosis not present

## 2018-12-27 ENCOUNTER — Other Ambulatory Visit: Payer: Self-pay

## 2018-12-27 ENCOUNTER — Ambulatory Visit (INDEPENDENT_AMBULATORY_CARE_PROVIDER_SITE_OTHER): Payer: Medicare HMO | Admitting: Family Medicine

## 2018-12-27 VITALS — BP 160/70 | HR 70 | Temp 97.7°F | Resp 18 | Wt 138.0 lb

## 2018-12-27 DIAGNOSIS — E785 Hyperlipidemia, unspecified: Secondary | ICD-10-CM | POA: Diagnosis not present

## 2018-12-27 DIAGNOSIS — I10 Essential (primary) hypertension: Secondary | ICD-10-CM

## 2018-12-27 NOTE — Progress Notes (Signed)
Subjective:    Patient ID: Jillian Koch, female    DOB: [birth year], 78 y.o.   MRN: [id number]  HPI 3/20 Patient is a very pleasant 78 year old Caucasian female here today for a routine checkup.  Blood pressure is elevated at 156/88.  She states she is compliant with her blood pressure medication.  She denies any chest pain shortness of breath or dyspnea on exertion.  She has not been checking her blood pressure at home.  She ran out of simvastatin several months ago.  She denies any myalgias or right upper quadrant pain.  She is overdue for mammogram.  She was still want to treat breast cancer if it was found and therefore I recommended a mammogram at her earliest convenience.  She is also overdue for Prevnar 13.  Pneumovax 23 was given in 2008.  At that time, my plan was: Blood pressure is elevated.  Have asked the patient to check her blood pressure in the morning and night for the next for 5 days and record the values.  She is to call me first of next week and if consistently elevated, I would add additional medication to bring down her blood pressure such as hydrochlorothiazide.  I will check a fasting lipid panel.  Ideally I like her LDL cholesterol well below 130.  We may need to increase simvastatin or switch to Crestor if her LDL cholesterol is elevated.  I will schedule the patient for mammogram to screen for breast cancer.  Patient received Prevnar 13 today in the office.  The remainder of her physical exam is normal.  12/27/18 Patient is a very sweet 78 year old Caucasian female here today for a checkup.  Since I last saw her she is started amlodipine 10 mg a day along with hydrochlorothiazide.  She states that her systolic blood pressure at home is less than [id number] and her diastolic blood pressure is usually in the 70s.  However this morning she has not taken her medication yet.  She also had a drive to the office intralaryngeal rain.  The travel is very dangerous this morning.  She was on  the highway next to tractor trailers and due to the treacherous condition she was very anxious driving.  I believe this is artificially elevate her blood pressure today.  She denies any chest pain shortness of breath or dyspnea on exertion.  She is overdue for the flu shot.  I strongly encourage that today but the patient declined.  Her mammogram was performed in July and was normal.  Past Medical History:  Diagnosis Date  . Hypertension    Past Surgical History:  Procedure Laterality Date  . ABDOMINAL HYSTERECTOMY    . APPENDECTOMY     Current Outpatient Medications on File Prior to Visit  Medication Sig Dispense Refill  . amLODipine (NORVASC) 10 MG tablet Take 1 tablet (10 mg total) by mouth daily. 90 tablet 2  . fish oil-omega-3 fatty acids 1000 MG capsule Take 1,200 mg by mouth daily.    . hydrochlorothiazide (HYDRODIURIL) 25 MG tablet Take 1 tablet (25 mg total) by mouth daily. 90 tablet 3  . rosuvastatin (CRESTOR) 10 MG tablet Take 1 tablet by mouth once daily 90 tablet 0   No current facility-administered medications on file prior to visit.    Allergies  Allergen Reactions  . Penicillins Rash   Social History   Socioeconomic History  . Marital status: Married    Spouse name: Not on file  . Number of children:  Not on file  . Years of education: Not on file  . Highest education level: Not on file  Occupational History  . Not on file  Social Needs  . Financial resource strain: Not on file  . Food insecurity    Worry: Not on file    Inability: Not on file  . Transportation needs    Medical: Not on file    Non-medical: Not on file  Tobacco Use  . Smoking status: Never Smoker  . Smokeless tobacco: Never Used  Substance and Sexual Activity  . Alcohol use: Never    Frequency: Never  . Drug use: Never  . Sexual activity: Not on file  Lifestyle  . Physical activity    Days per week: Not on file    Minutes per session: Not on file  . Stress: Not on file   Relationships  . Social Herbalist on phone: Not on file    Gets together: Not on file    Attends religious service: Not on file    Active member of club or organization: Not on file    Attends meetings of clubs or organizations: Not on file    Relationship status: Not on file  . Intimate partner violence    Fear of current or ex partner: Not on file    Emotionally abused: Not on file    Physically abused: Not on file    Forced sexual activity: Not on file  Other Topics Concern  . Not on file  Social History Narrative  . Not on file    Review of Systems  All other systems reviewed and are negative.      Objective:   Physical Exam Vitals signs reviewed.  Constitutional:      Appearance: Normal appearance.  Cardiovascular:     Rate and Rhythm: Normal rate and regular rhythm.     Heart sounds: Normal heart sounds. No murmur. No friction rub. No gallop.   Pulmonary:     Effort: Pulmonary effort is normal. No respiratory distress.     Breath sounds: Normal breath sounds. No wheezing, rhonchi or rales.  Abdominal:     General: Bowel sounds are normal. There is no distension.     Palpations: Abdomen is soft.     Tenderness: There is no abdominal tenderness. There is no guarding.  Musculoskeletal:     Right lower leg: No edema.     Left lower leg: No edema.  Neurological:     Mental Status: She is alert.           Assessment & Plan:  Benign essential HTN - Plan: CBC with Differential, COMPLETE METABOLIC PANEL WITH GFR, Lipid Panel  Hyperlipidemia, unspecified hyperlipidemia type  Blood pressure today is elevated despite taking amlodipine and hydrochlorothiazide however I believe some of this is spurious due to the treacherous weather conditions outside and the fact the patient has not taken her medication.  I asked her to check her blood pressure every day and record the values and call us if she consistently sees a systolic blood pressure greater than [id number]  or the diastolic blood pressure greater than 90.  If so I would recommend adding losartan.  Strongly encouraged flu shot but the patient politely declined.  Check a CBC, CMP, fasting lipid panel.

## 2018-12-28 LAB — COMPLETE METABOLIC PANEL WITH GFR
AG Ratio: 1.6 (calc) (ref 1.0–2.5)
ALT: 16 U/L (ref 6–29)
AST: 17 U/L (ref 10–35)
Albumin: 4.3 g/dL (ref 3.6–5.1)
Alkaline phosphatase (APISO): 57 U/L (ref 37–153)
BUN: 16 mg/dL (ref 7–25)
CO2: 26 mmol/L (ref 20–32)
Calcium: 9.9 mg/dL (ref 8.6–10.4)
Chloride: 102 mmol/L (ref 98–110)
Creat: 0.74 mg/dL (ref 0.60–0.93)
GFR, Est African American: 90 mL/min/{1.73_m2} (ref >=60)
GFR, Est Non African American: 78 mL/min/{1.73_m2} (ref >=60)
Globulin: 2.7 g/dL (calc) (ref 1.9–3.7)
Glucose, Bld: 95 mg/dL (ref 65–99)
Potassium: 3.7 mmol/L (ref 3.5–5.3)
Sodium: 140 mmol/L (ref 135–146)
Total Bilirubin: 0.5 mg/dL (ref 0.2–1.2)
Total Protein: 7 g/dL (ref 6.1–8.1)

## 2018-12-28 LAB — LIPID PANEL
Cholesterol: 157 mg/dL (ref <200)
HDL: 66 mg/dL (ref >=50)
LDL Cholesterol (Calc): 75 mg/dL (calc)
Non-HDL Cholesterol (Calc): 91 mg/dL (calc) (ref <130)
Total CHOL/HDL Ratio: 2.4 (calc) (ref <5.0)
Triglycerides: 78 mg/dL (ref <150)

## 2018-12-28 LAB — CBC WITH DIFFERENTIAL/PLATELET
Absolute Monocytes: 739 cells/uL (ref 200–950)
Basophils Absolute: 62 cells/uL (ref 0–200)
Basophils Relative: 0.8 %
Eosinophils Absolute: 108 cells/uL (ref 15–500)
Eosinophils Relative: 1.4 %
HCT: 38.4 % (ref 35.0–45.0)
Hemoglobin: 12.7 g/dL (ref 11.7–15.5)
Lymphs Abs: 2664 cells/uL (ref 850–3900)
MCH: 30.5 pg (ref 27.0–33.0)
MCHC: 33.1 g/dL (ref 32.0–36.0)
MCV: 92.3 fL (ref 80.0–100.0)
MPV: 9.9 fL (ref 7.5–12.5)
Monocytes Relative: 9.6 %
Neutro Abs: 4127 cells/uL (ref 1500–7800)
Neutrophils Relative %: 53.6 %
Platelets: 264 10*3/uL (ref 140–400)
RBC: 4.16 10*6/uL (ref 3.80–5.10)
RDW: 13.4 % (ref 11.0–15.0)
Total Lymphocyte: 34.6 %
WBC: 7.7 10*3/uL (ref 3.8–10.8)

## 2019-02-01 DIAGNOSIS — H2513 Age-related nuclear cataract, bilateral: Secondary | ICD-10-CM | POA: Diagnosis not present

## 2019-02-01 DIAGNOSIS — H25013 Cortical age-related cataract, bilateral: Secondary | ICD-10-CM | POA: Diagnosis not present

## 2019-02-01 DIAGNOSIS — H524 Presbyopia: Secondary | ICD-10-CM | POA: Diagnosis not present

## 2019-02-01 DIAGNOSIS — H401322 Pigmentary glaucoma, left eye, moderate stage: Secondary | ICD-10-CM | POA: Diagnosis not present

## 2019-02-06 ENCOUNTER — Other Ambulatory Visit: Payer: Self-pay | Admitting: Family Medicine

## 2019-03-07 DIAGNOSIS — I10 Essential (primary) hypertension: Secondary | ICD-10-CM | POA: Diagnosis not present

## 2019-03-07 DIAGNOSIS — R69 Illness, unspecified: Secondary | ICD-10-CM | POA: Diagnosis not present

## 2019-03-07 DIAGNOSIS — M25612 Stiffness of left shoulder, not elsewhere classified: Secondary | ICD-10-CM | POA: Diagnosis not present

## 2019-03-14 ENCOUNTER — Other Ambulatory Visit: Payer: Self-pay | Admitting: Family Medicine

## 2019-03-14 DIAGNOSIS — I10 Essential (primary) hypertension: Secondary | ICD-10-CM

## 2019-05-19 ENCOUNTER — Other Ambulatory Visit: Payer: Self-pay | Admitting: Family Medicine

## 2019-06-18 DIAGNOSIS — I10 Essential (primary) hypertension: Secondary | ICD-10-CM | POA: Diagnosis not present

## 2019-06-18 DIAGNOSIS — E663 Overweight: Secondary | ICD-10-CM | POA: Diagnosis not present

## 2019-06-18 DIAGNOSIS — Z823 Family history of stroke: Secondary | ICD-10-CM | POA: Diagnosis not present

## 2019-06-18 DIAGNOSIS — H409 Unspecified glaucoma: Secondary | ICD-10-CM | POA: Diagnosis not present

## 2019-06-18 DIAGNOSIS — Z008 Encounter for other general examination: Secondary | ICD-10-CM | POA: Diagnosis not present

## 2019-06-18 DIAGNOSIS — E785 Hyperlipidemia, unspecified: Secondary | ICD-10-CM | POA: Diagnosis not present

## 2019-06-18 DIAGNOSIS — Z6827 Body mass index (BMI) 27.0-27.9, adult: Secondary | ICD-10-CM | POA: Diagnosis not present

## 2019-06-18 DIAGNOSIS — Z809 Family history of malignant neoplasm, unspecified: Secondary | ICD-10-CM | POA: Diagnosis not present

## 2019-06-24 ENCOUNTER — Other Ambulatory Visit: Payer: Self-pay | Admitting: Family Medicine

## 2019-06-24 DIAGNOSIS — I10 Essential (primary) hypertension: Secondary | ICD-10-CM

## 2019-08-02 DIAGNOSIS — H401331 Pigmentary glaucoma, bilateral, mild stage: Secondary | ICD-10-CM | POA: Diagnosis not present

## 2019-08-03 ENCOUNTER — Other Ambulatory Visit: Payer: Self-pay | Admitting: Family Medicine

## 2019-08-22 ENCOUNTER — Ambulatory Visit: Payer: Medicare HMO | Admitting: Family Medicine

## 2019-08-26 ENCOUNTER — Other Ambulatory Visit: Payer: Self-pay

## 2019-08-26 ENCOUNTER — Ambulatory Visit (INDEPENDENT_AMBULATORY_CARE_PROVIDER_SITE_OTHER): Payer: Medicare HMO | Admitting: Family Medicine

## 2019-08-26 VITALS — BP 130/62 | HR 64 | Temp 97.8°F | Wt 141.0 lb

## 2019-08-26 DIAGNOSIS — E7800 Pure hypercholesterolemia, unspecified: Secondary | ICD-10-CM

## 2019-08-26 DIAGNOSIS — I10 Essential (primary) hypertension: Secondary | ICD-10-CM | POA: Diagnosis not present

## 2019-08-26 NOTE — Progress Notes (Signed)
Subjective:    Patient ID: Jillian Koch, female    DOB: February 24, 1940, 79 y.o.   MRN: [id number]  HPI Patient is a very pleasant 79 year old Caucasian female here today for checkup.  Her blood pressure today is outstanding at 130/62.  She denies any chest pain shortness of breath or dyspnea on exertion.  She denies any myalgias or right upper quadrant pain.  On her exam today there is a slight murmur heard best over the aortic valve that I would grade as a 1/6.  I have not appreciated this before.  It is totally asymptomatic. Past Medical History:  Diagnosis Date  . Hypertension    Past Surgical History:  Procedure Laterality Date  . ABDOMINAL HYSTERECTOMY    . APPENDECTOMY     Current Outpatient Medications on File Prior to Visit  Medication Sig Dispense Refill  . amLODipine (NORVASC) 10 MG tablet Take 1 tablet by mouth once daily 90 tablet 0  . fish oil-omega-3 fatty acids 1000 MG capsule Take 1,200 mg by mouth daily.    . hydrochlorothiazide (HYDRODIURIL) 25 MG tablet Take 1 tablet by mouth once daily 90 tablet 3  . rosuvastatin (CRESTOR) 10 MG tablet Take 1 tablet by mouth once daily 30 tablet 0   No current facility-administered medications on file prior to visit.   Allergies  Allergen Reactions  . Penicillins Rash   Social History   Socioeconomic History  . Marital status: Married    Spouse name: Not on file  . Number of children: Not on file  . Years of education: Not on file  . Highest education level: Not on file  Occupational History  . Not on file  Tobacco Use  . Smoking status: Never Smoker  . Smokeless tobacco: Never Used  Vaping Use  . Vaping Use: Never used  Substance and Sexual Activity  . Alcohol use: Never  . Drug use: Never  . Sexual activity: Not on file  Other Topics Concern  . Not on file  Social History Narrative  . Not on file   Social Determinants of Health   Financial Resource Strain:   . Difficulty of Paying Living Expenses:   Food  Insecurity:   . Worried About Charity fundraiser in the Last Year:   . Arboriculturist in the Last Year:   Transportation Needs:   . Film/video editor (Medical):   Marland Kitchen Lack of Transportation (Non-Medical):   Physical Activity:   . Days of Exercise per Week:   . Minutes of Exercise per Session:   Stress:   . Feeling of Stress :   Social Connections:   . Frequency of Communication with Friends and Family:   . Frequency of Social Gatherings with Friends and Family:   . Attends Religious Services:   . Active Member of Clubs or Organizations:   . Attends Archivist Meetings:   Marland Kitchen Marital Status:   Intimate Partner Violence:   . Fear of Current or Ex-Partner:   . Emotionally Abused:   Marland Kitchen Physically Abused:   . Sexually Abused:     Review of Systems  All other systems reviewed and are negative.      Objective:   Physical Exam Vitals reviewed.  Constitutional:      Appearance: Normal appearance.  Cardiovascular:     Rate and Rhythm: Normal rate and regular rhythm.     Heart sounds: Murmur heard.  No friction rub. No gallop.   Pulmonary:  Effort: Pulmonary effort is normal. No respiratory distress.     Breath sounds: Normal breath sounds. No wheezing, rhonchi or rales.  Abdominal:     General: Bowel sounds are normal. There is no distension.     Palpations: Abdomen is soft.     Tenderness: There is no abdominal tenderness. There is no guarding.  Musculoskeletal:     Right lower leg: No edema.     Left lower leg: No edema.  Neurological:     Mental Status: She is alert.           Assessment & Plan:  Pure hypercholesterolemia - Plan: CBC with Differential/Platelet, COMPLETE METABOLIC PANEL WITH GFR, Lipid panel  Benign essential HTN  I am very happy with her blood pressure today.  I will check a CBC, CMP, fasting lipid panel.  She has had her Covid vaccination.  Regular anticipatory guidance is provided.  I believe she is developing some aortic  stenosis.  I will monitor this clinically.  If the murmur worsens or she develops symptoms I would recommend an echocardiogram.

## 2019-08-27 LAB — COMPLETE METABOLIC PANEL WITH GFR
AG Ratio: 1.7 (calc) (ref 1.0–2.5)
ALT: 16 U/L (ref 6–29)
AST: 18 U/L (ref 10–35)
Albumin: 4.3 g/dL (ref 3.6–5.1)
Alkaline phosphatase (APISO): 63 U/L (ref 37–153)
BUN: 22 mg/dL (ref 7–25)
CO2: 27 mmol/L (ref 20–32)
Calcium: 9.5 mg/dL (ref 8.6–10.4)
Chloride: 103 mmol/L (ref 98–110)
Creat: 0.81 mg/dL (ref 0.60–0.93)
GFR, Est African American: 80 mL/min/{1.73_m2} (ref >=60)
GFR, Est Non African American: 69 mL/min/{1.73_m2} (ref >=60)
Globulin: 2.5 g/dL (calc) (ref 1.9–3.7)
Glucose, Bld: 88 mg/dL (ref 65–99)
Potassium: 3.7 mmol/L (ref 3.5–5.3)
Sodium: 140 mmol/L (ref 135–146)
Total Bilirubin: 0.6 mg/dL (ref 0.2–1.2)
Total Protein: 6.8 g/dL (ref 6.1–8.1)

## 2019-08-27 LAB — CBC WITH DIFFERENTIAL/PLATELET
Absolute Monocytes: 739 cells/uL (ref 200–950)
Basophils Absolute: 50 cells/uL (ref 0–200)
Basophils Relative: 0.6 %
Eosinophils Absolute: 83 cells/uL (ref 15–500)
Eosinophils Relative: 1 %
HCT: 38.8 % (ref 35.0–45.0)
Hemoglobin: 12.8 g/dL (ref 11.7–15.5)
Lymphs Abs: 2971 cells/uL (ref 850–3900)
MCH: 30.6 pg (ref 27.0–33.0)
MCHC: 33 g/dL (ref 32.0–36.0)
MCV: 92.8 fL (ref 80.0–100.0)
MPV: 9.9 fL (ref 7.5–12.5)
Monocytes Relative: 8.9 %
Neutro Abs: 4457 cells/uL (ref 1500–7800)
Neutrophils Relative %: 53.7 %
Platelets: 263 10*3/uL (ref 140–400)
RBC: 4.18 10*6/uL (ref 3.80–5.10)
RDW: 12.6 % (ref 11.0–15.0)
Total Lymphocyte: 35.8 %
WBC: 8.3 10*3/uL (ref 3.8–10.8)

## 2019-08-27 LAB — LIPID PANEL
Cholesterol: 175 mg/dL (ref <200)
HDL: 65 mg/dL (ref >=50)
LDL Cholesterol (Calc): 95 mg/dL (calc)
Non-HDL Cholesterol (Calc): 110 mg/dL (calc) (ref <130)
Total CHOL/HDL Ratio: 2.7 (calc) (ref <5.0)
Triglycerides: 60 mg/dL (ref <150)

## 2019-09-05 ENCOUNTER — Other Ambulatory Visit: Payer: Self-pay | Admitting: Family Medicine

## 2019-09-30 ENCOUNTER — Other Ambulatory Visit: Payer: Self-pay | Admitting: Family Medicine

## 2019-09-30 DIAGNOSIS — I10 Essential (primary) hypertension: Secondary | ICD-10-CM

## 2019-11-29 DIAGNOSIS — H25013 Cortical age-related cataract, bilateral: Secondary | ICD-10-CM | POA: Diagnosis not present

## 2019-11-29 DIAGNOSIS — H2513 Age-related nuclear cataract, bilateral: Secondary | ICD-10-CM | POA: Diagnosis not present

## 2019-11-29 DIAGNOSIS — H43813 Vitreous degeneration, bilateral: Secondary | ICD-10-CM | POA: Diagnosis not present

## 2019-11-29 DIAGNOSIS — H52203 Unspecified astigmatism, bilateral: Secondary | ICD-10-CM | POA: Diagnosis not present

## 2019-12-07 ENCOUNTER — Other Ambulatory Visit: Payer: Self-pay | Admitting: Family Medicine

## 2019-12-12 DIAGNOSIS — H2511 Age-related nuclear cataract, right eye: Secondary | ICD-10-CM | POA: Diagnosis not present

## 2019-12-12 DIAGNOSIS — H25011 Cortical age-related cataract, right eye: Secondary | ICD-10-CM | POA: Diagnosis not present

## 2019-12-12 DIAGNOSIS — H25811 Combined forms of age-related cataract, right eye: Secondary | ICD-10-CM | POA: Diagnosis not present

## 2019-12-13 ENCOUNTER — Other Ambulatory Visit: Payer: Self-pay

## 2020-01-02 ENCOUNTER — Other Ambulatory Visit: Payer: Self-pay | Admitting: Family Medicine

## 2020-01-02 DIAGNOSIS — I10 Essential (primary) hypertension: Secondary | ICD-10-CM

## 2020-01-16 DIAGNOSIS — H25812 Combined forms of age-related cataract, left eye: Secondary | ICD-10-CM | POA: Diagnosis not present

## 2020-01-16 DIAGNOSIS — H2181 Floppy iris syndrome: Secondary | ICD-10-CM | POA: Diagnosis not present

## 2020-01-16 DIAGNOSIS — H2512 Age-related nuclear cataract, left eye: Secondary | ICD-10-CM | POA: Diagnosis not present

## 2020-01-16 DIAGNOSIS — H25012 Cortical age-related cataract, left eye: Secondary | ICD-10-CM | POA: Diagnosis not present

## 2020-02-13 ENCOUNTER — Other Ambulatory Visit: Payer: Medicare HMO

## 2020-02-13 ENCOUNTER — Other Ambulatory Visit: Payer: Self-pay

## 2020-02-17 ENCOUNTER — Other Ambulatory Visit: Payer: Self-pay

## 2020-02-17 ENCOUNTER — Other Ambulatory Visit: Payer: Medicare HMO

## 2020-02-17 DIAGNOSIS — E785 Hyperlipidemia, unspecified: Secondary | ICD-10-CM

## 2020-02-17 DIAGNOSIS — I10 Essential (primary) hypertension: Secondary | ICD-10-CM | POA: Diagnosis not present

## 2020-02-17 DIAGNOSIS — E7800 Pure hypercholesterolemia, unspecified: Secondary | ICD-10-CM

## 2020-02-17 DIAGNOSIS — Z1159 Encounter for screening for other viral diseases: Secondary | ICD-10-CM | POA: Diagnosis not present

## 2020-02-17 LAB — CBC WITH DIFFERENTIAL/PLATELET
Eosinophils Absolute: 81 cells/uL (ref 15–500)
Eosinophils Relative: 1.1 %
MCHC: 33.7 g/dL (ref 32.0–36.0)
Platelets: 240 10*3/uL (ref 140–400)

## 2020-02-17 LAB — LIPID PANEL: LDL Cholesterol (Calc): 84 mg/dL (calc)

## 2020-02-17 LAB — COMPLETE METABOLIC PANEL WITH GFR
Alkaline phosphatase (APISO): 59 U/L (ref 37–153)
Creat: 0.79 mg/dL (ref 0.60–0.93)
GFR, Est African American: 83 mL/min/{1.73_m2} (ref >=60)
Glucose, Bld: 100 mg/dL — ABNORMAL HIGH (ref 65–99)

## 2020-02-18 ENCOUNTER — Other Ambulatory Visit: Payer: Self-pay

## 2020-02-18 ENCOUNTER — Ambulatory Visit (INDEPENDENT_AMBULATORY_CARE_PROVIDER_SITE_OTHER): Payer: Medicare HMO | Admitting: Family Medicine

## 2020-02-18 ENCOUNTER — Encounter: Payer: Self-pay | Admitting: Family Medicine

## 2020-02-18 VITALS — BP 128/84 | HR 61 | Temp 98.2°F | Ht 60.0 in | Wt 145.0 lb

## 2020-02-18 DIAGNOSIS — I10 Essential (primary) hypertension: Secondary | ICD-10-CM | POA: Diagnosis not present

## 2020-02-18 DIAGNOSIS — E7800 Pure hypercholesterolemia, unspecified: Secondary | ICD-10-CM

## 2020-02-18 LAB — HEPATITIS C ANTIBODY
Hepatitis C Ab: NONREACTIVE
SIGNAL TO CUT-OFF: 0.01 (ref <1.00)

## 2020-02-18 LAB — LIPID PANEL
Cholesterol: 165 mg/dL (ref <200)
HDL: 64 mg/dL (ref >=50)
Non-HDL Cholesterol (Calc): 101 mg/dL (calc) (ref <130)
Total CHOL/HDL Ratio: 2.6 (calc) (ref <5.0)
Triglycerides: 82 mg/dL (ref <150)

## 2020-02-18 LAB — COMPLETE METABOLIC PANEL WITH GFR
AG Ratio: 1.7 (calc) (ref 1.0–2.5)
ALT: 16 U/L (ref 6–29)
AST: 18 U/L (ref 10–35)
Albumin: 4.3 g/dL (ref 3.6–5.1)
BUN: 20 mg/dL (ref 7–25)
CO2: 28 mmol/L (ref 20–32)
Calcium: 9.7 mg/dL (ref 8.6–10.4)
Chloride: 105 mmol/L (ref 98–110)
GFR, Est Non African American: 71 mL/min/{1.73_m2} (ref >=60)
Globulin: 2.6 g/dL (calc) (ref 1.9–3.7)
Potassium: 3.8 mmol/L (ref 3.5–5.3)
Sodium: 142 mmol/L (ref 135–146)
Total Bilirubin: 0.4 mg/dL (ref 0.2–1.2)
Total Protein: 6.9 g/dL (ref 6.1–8.1)

## 2020-02-18 LAB — CBC WITH DIFFERENTIAL/PLATELET
Absolute Monocytes: 607 cells/uL (ref 200–950)
Basophils Absolute: 52 cells/uL (ref 0–200)
Basophils Relative: 0.7 %
HCT: 40.6 % (ref 35.0–45.0)
Hemoglobin: 13.7 g/dL (ref 11.7–15.5)
Lymphs Abs: 2694 cells/uL (ref 850–3900)
MCH: 31.4 pg (ref 27.0–33.0)
MCV: 93.1 fL (ref 80.0–100.0)
MPV: 9.6 fL (ref 7.5–12.5)
Monocytes Relative: 8.2 %
Neutro Abs: 3966 cells/uL (ref 1500–7800)
Neutrophils Relative %: 53.6 %
RBC: 4.36 10*6/uL (ref 3.80–5.10)
RDW: 12.5 % (ref 11.0–15.0)
Total Lymphocyte: 36.4 %
WBC: 7.4 10*3/uL (ref 3.8–10.8)

## 2020-02-18 NOTE — Progress Notes (Signed)
Subjective:    Patient ID: Jillian Koch, female    DOB: 1941-02-13, 80 y.o.   MRN: [id number]  HPI Patient is a very pleasant 80 year old Caucasian female here today for checkup.  Her blood pressure today is outstanding at 124/84.  She denies any chest pain shortness of breath or dyspnea on exertion.  She denies any myalgias or right upper quadrant pain.  She has had her Covid vaccine.  She politely declines a flu shot.  Most recent lab work is listed below: Lab on 02/17/2020  Component Date Value Ref Range Status  . Hepatitis C Ab 02/17/2020 NON-REACTIVE  NON-REACTI Final  . SIGNAL TO CUT-OFF 02/17/2020 0.01  <1.00 Final   Comment: . HCV antibody was non-reactive. There is no laboratory  evidence of HCV infection. . In most cases, no further action is required. However, if recent HCV exposure is suspected, a test for HCV RNA (test code [phone]) is suggested. . For additional information please refer to http://education.[facility].com/faq/FAQ22v1 (This link is being provided for informational/ educational purposes only.) .   Marland Kitchen Cholesterol 02/17/2020 165  <200 mg/dL Final  . HDL 02/17/2020 64  > OR = 50 mg/dL Final  . Triglycerides 02/17/2020 82  <150 mg/dL Final  . LDL Cholesterol (Calc) 02/17/2020 84  mg/dL (calc) Final   Comment: Reference range: <100 . Desirable range <100 mg/dL for primary prevention;   <70 mg/dL for patients with CHD or diabetic patients  with > or = 2 CHD risk factors. Marland Kitchen LDL-C is now calculated using the Boyadjian-Hopkins  calculation, which is a validated novel method providing  better accuracy than the Friedewald equation in the  estimation of LDL-C.  Cresenciano Genre et al. Annamaria Helling. [id number]): 2061-2068  (http://education.[facility].com/faq/FAQ164)   . Total CHOL/HDL Ratio 02/17/2020 2.6  <5.0 (calc) Final  . Non-HDL Cholesterol (Calc) 02/17/2020 101  <130 mg/dL (calc) Final   Comment: For patients with diabetes plus 1 major ASCVD risk   factor, treating to a non-HDL-C goal of <100 mg/dL  (LDL-C of <70 mg/dL) is considered a therapeutic  option.   . WBC 02/17/2020 7.4  3.8 - 10.8 Thousand/uL Final  . RBC 02/17/2020 4.36  3.80 - 5.10 Million/uL Final  . Hemoglobin 02/17/2020 13.7  11.7 - 15.5 g/dL Final  . HCT 02/17/2020 40.6  35.0 - 45.0 % Final  . MCV 02/17/2020 93.1  80.0 - 100.0 fL Final  . MCH 02/17/2020 31.4  27.0 - 33.0 pg Final  . MCHC 02/17/2020 33.7  32.0 - 36.0 g/dL Final  . RDW 02/17/2020 12.5  11.0 - 15.0 % Final  . Platelets 02/17/2020 240  140 - 400 Thousand/uL Final  . MPV 02/17/2020 9.6  7.5 - 12.5 fL Final  . Neutro Abs 02/17/2020 3,966  1,500 - 7,800 cells/uL Final  . Lymphs Abs 02/17/2020 2,694  850 - 3,900 cells/uL Final  . Absolute Monocytes 02/17/2020 607  200 - 950 cells/uL Final  . Eosinophils Absolute 02/17/2020 81  15 - 500 cells/uL Final  . Basophils Absolute 02/17/2020 52  0 - 200 cells/uL Final  . Neutrophils Relative % 02/17/2020 53.6  % Final  . Total Lymphocyte 02/17/2020 36.4  % Final  . Monocytes Relative 02/17/2020 8.2  % Final  . Eosinophils Relative 02/17/2020 1.1  % Final  . Basophils Relative 02/17/2020 0.7  % Final  . Glucose, Bld 02/17/2020 100* 65 - 99 mg/dL Final   Comment: .            Fasting  reference interval . For someone without known diabetes, a glucose value between 100 and 125 mg/dL is consistent with prediabetes and should be confirmed with a follow-up test. .   . BUN 02/17/2020 20  7 - 25 mg/dL Final  . Creat 02/17/2020 0.79  0.60 - 0.93 mg/dL Final   Comment: For patients >80 years of age, the reference limit for Creatinine is approximately 13% higher for people identified as African-American. .   . GFR, Est Non African American 02/17/2020 71  > OR = 60 mL/min/1.81m2 Final  . GFR, Est African American 02/17/2020 83  > OR = 60 mL/min/1.56m2 Final  . BUN/Creatinine Ratio [id number] NOT APPLICABLE  6 - 22 (calc) Final  . Sodium 02/17/2020 142  135 - 146  mmol/L Final  . Potassium 02/17/2020 3.8  3.5 - 5.3 mmol/L Final  . Chloride 02/17/2020 105  98 - 110 mmol/L Final  . CO2 02/17/2020 28  20 - 32 mmol/L Final  . Calcium 02/17/2020 9.7  8.6 - 10.4 mg/dL Final  . Total Protein 02/17/2020 6.9  6.1 - 8.1 g/dL Final  . Albumin 02/17/2020 4.3  3.6 - 5.1 g/dL Final  . Globulin 02/17/2020 2.6  1.9 - 3.7 g/dL (calc) Final  . AG Ratio 02/17/2020 1.7  1.0 - 2.5 (calc) Final  . Total Bilirubin 02/17/2020 0.4  0.2 - 1.2 mg/dL Final  . Alkaline phosphatase (APISO) 02/17/2020 59  37 - 153 U/L Final  . AST 02/17/2020 18  10 - 35 U/L Final  . ALT 02/17/2020 16  6 - 29 U/L Final    Past Medical History:  Diagnosis Date  . Hypertension    Past Surgical History:  Procedure Laterality Date  . ABDOMINAL HYSTERECTOMY    . APPENDECTOMY     Current Outpatient Medications on File Prior to Visit  Medication Sig Dispense Refill  . amLODipine (NORVASC) 10 MG tablet Take 1 tablet by mouth once daily 90 tablet 0  . fish oil-omega-3 fatty acids 1000 MG capsule Take 1,200 mg by mouth daily.    . hydrochlorothiazide (HYDRODIURIL) 25 MG tablet Take 1 tablet by mouth once daily 90 tablet 3  . rosuvastatin (CRESTOR) 10 MG tablet Take 1 tablet by mouth once daily 90 tablet 0  . zinc gluconate 50 MG tablet Take 50 mg by mouth daily.     No current facility-administered medications on file prior to visit.   Allergies  Allergen Reactions  . Penicillins Rash   Social History   Socioeconomic History  . Marital status: Married    Spouse name: Not on file  . Number of children: Not on file  . Years of education: Not on file  . Highest education level: Not on file  Occupational History  . Not on file  Tobacco Use  . Smoking status: Never Smoker  . Smokeless tobacco: Never Used  Vaping Use  . Vaping Use: Never used  Substance and Sexual Activity  . Alcohol use: Never  . Drug use: Never  . Sexual activity: Not on file  Other Topics Concern  . Not on file   Social History Narrative  . Not on file   Social Determinants of Health   Financial Resource Strain: Not on file  Food Insecurity: Not on file  Transportation Needs: Not on file  Physical Activity: Not on file  Stress: Not on file  Social Connections: Not on file  Intimate Partner Violence: Not on file    Review of Systems  All other  systems reviewed and are negative.      Objective:   Physical Exam Vitals reviewed.  Constitutional:      Appearance: Normal appearance.  Cardiovascular:     Rate and Rhythm: Normal rate and regular rhythm.     Heart sounds: Murmur heard.  No friction rub. No gallop.   Pulmonary:     Effort: Pulmonary effort is normal. No respiratory distress.     Breath sounds: Normal breath sounds. No wheezing, rhonchi or rales.  Abdominal:     General: Bowel sounds are normal. There is no distension.     Palpations: Abdomen is soft.     Tenderness: There is no abdominal tenderness. There is no guarding.  Musculoskeletal:     Right lower leg: No edema.     Left lower leg: No edema.  Neurological:     Mental Status: She is alert.           Assessment & Plan:  Pure hypercholesterolemia  Benign essential HTN  Labs are outstanding.  Blood pressure is outstanding.  I would make no changes in her therapy at the present time.  I encouraged the patient to reconsider a flu shot.  Her Covid vaccine is up-to-date.  Due to her age, she does not require colonoscopy or a Pap smear.  She denies any falls, depression, or memory loss

## 2020-03-15 ENCOUNTER — Other Ambulatory Visit: Payer: Self-pay | Admitting: Family Medicine

## 2020-03-21 ENCOUNTER — Other Ambulatory Visit: Payer: Self-pay | Admitting: Family Medicine

## 2020-04-14 ENCOUNTER — Other Ambulatory Visit: Payer: Self-pay | Admitting: Family Medicine

## 2020-04-14 DIAGNOSIS — I10 Essential (primary) hypertension: Secondary | ICD-10-CM

## 2020-04-17 ENCOUNTER — Other Ambulatory Visit: Payer: Self-pay | Admitting: Family Medicine

## 2020-04-17 DIAGNOSIS — I10 Essential (primary) hypertension: Secondary | ICD-10-CM

## 2020-04-30 DIAGNOSIS — L814 Other melanin hyperpigmentation: Secondary | ICD-10-CM | POA: Diagnosis not present

## 2020-04-30 DIAGNOSIS — L821 Other seborrheic keratosis: Secondary | ICD-10-CM | POA: Diagnosis not present

## 2020-04-30 DIAGNOSIS — L720 Epidermal cyst: Secondary | ICD-10-CM | POA: Diagnosis not present

## 2020-04-30 DIAGNOSIS — D1801 Hemangioma of skin and subcutaneous tissue: Secondary | ICD-10-CM | POA: Diagnosis not present

## 2020-04-30 DIAGNOSIS — D225 Melanocytic nevi of trunk: Secondary | ICD-10-CM | POA: Diagnosis not present

## 2020-06-08 ENCOUNTER — Other Ambulatory Visit: Payer: Self-pay | Admitting: Family Medicine

## 2020-06-11 DIAGNOSIS — L821 Other seborrheic keratosis: Secondary | ICD-10-CM | POA: Diagnosis not present

## 2020-07-08 DIAGNOSIS — H04123 Dry eye syndrome of bilateral lacrimal glands: Secondary | ICD-10-CM | POA: Diagnosis not present

## 2020-07-08 DIAGNOSIS — H02052 Trichiasis without entropian right lower eyelid: Secondary | ICD-10-CM | POA: Diagnosis not present

## 2020-07-08 DIAGNOSIS — H401331 Pigmentary glaucoma, bilateral, mild stage: Secondary | ICD-10-CM | POA: Diagnosis not present

## 2020-07-09 NOTE — Progress Notes (Signed)
Subjective:   Jillian Koch is a 80 y.o. female who presents for Medicare Annual (Subsequent) preventive examination.  Review of Systems    N/A  Cardiac Risk Factors include: advanced age (>58men, >69 women);hypertension;dyslipidemia     Objective:    Today's Vitals   07/10/20 1100  BP: (!) 130/52  Temp: 98.7 F (37.1 C)  TempSrc: Oral  Weight: 145 lb (65.8 kg)  Height: 5' (1.524 m)   Body mass index is 28.32 kg/m.  Advanced Directives 07/10/2020 02/05/2018  Does Patient Have a Medical Advance Directive? No Yes  Type of Advance Directive - [place];Living will  Does patient want to make changes to medical advance directive? - No - Patient declined  Copy of [place] in Chart? - No - copy requested  Would patient like information on creating a medical advance directive? No - Patient declined No - Patient declined    Current Medications (verified) Outpatient Encounter Medications as of 07/10/2020  Medication Sig  . amLODipine (NORVASC) 10 MG tablet Take 1 tablet by mouth once daily  . COD LIVER OIL PO Take by mouth.  . fish oil-omega-3 fatty acids 1000 MG capsule Take 1,200 mg by mouth daily.  . Ginkgo Biloba (GNP GINGKO BILOBA EXTRACT PO) Take by mouth.  . hydrochlorothiazide (HYDRODIURIL) 25 MG tablet Take 1 tablet by mouth once daily  . Multiple Vitamins-Minerals (PRESERVISION AREDS PO) Take by mouth.  . rosuvastatin (CRESTOR) 10 MG tablet Take 1 tablet by mouth once daily  . vitamin C (ASCORBIC ACID) 500 MG tablet Take 500 mg by mouth daily.  Marland Kitchen zinc gluconate 50 MG tablet Take 50 mg by mouth daily.   No facility-administered encounter medications on file as of 07/10/2020.    Allergies (verified) Penicillins   History: Past Medical History:  Diagnosis Date  . Hypertension    Past Surgical History:  Procedure Laterality Date  . ABDOMINAL HYSTERECTOMY    . APPENDECTOMY     Family History  Problem Relation Age of  Onset  . Colon cancer Maternal Aunt   . Breast cancer Sister    Social History   Socioeconomic History  . Marital status: Married    Spouse name: Not on file  . Number of children: Not on file  . Years of education: Not on file  . Highest education level: Not on file  Occupational History  . Not on file  Tobacco Use  . Smoking status: Never Smoker  . Smokeless tobacco: Never Used  Vaping Use  . Vaping Use: Never used  Substance and Sexual Activity  . Alcohol use: Never  . Drug use: Never  . Sexual activity: Not on file  Other Topics Concern  . Not on file  Social History Narrative  . Not on file   Social Determinants of Health   Financial Resource Strain: Low Risk   . Difficulty of Paying Living Expenses: Not hard at all  Food Insecurity: No Food Insecurity  . Worried About Charity fundraiser in the Last Year: Never true  . Ran Out of Food in the Last Year: Never true  Transportation Needs: No Transportation Needs  . Lack of Transportation (Medical): No  . Lack of Transportation (Non-Medical): No  Physical Activity: Sufficiently Active  . Days of Exercise per Week: 7 days  . Minutes of Exercise per Session: 30 min  Stress: No Stress Concern Present  . Feeling of Stress : Not at all  Social Connections: Moderately  Integrated  . Frequency of Communication with Friends and Family: More than three times a week  . Frequency of Social Gatherings with Friends and Family: More than three times a week  . Attends Religious Services: More than 4 times per year  . Active Member of Clubs or Organizations: No  . Attends Archivist Meetings: Never  . Marital Status: Married    Tobacco Counseling Counseling given: Not Answered   Clinical Intake:  Pre-visit preparation completed: Yes  Pain : No/denies pain     Nutritional Risks: None Diabetes: No     Diabetic?No  Interpreter Needed?: No  Information entered by :: [place] of Daily  Living In your present state of health, do you have any difficulty performing the following activities: 07/10/2020  Hearing? N  Vision? N  Difficulty concentrating or making decisions? N  Walking or climbing stairs? N  Dressing or bathing? N  Doing errands, shopping? N  Preparing Food and eating ? N  Using the Toilet? N  In the past six months, have you accidently leaked urine? N  Do you have problems with loss of bowel control? N  Managing your Medications? N  Managing your Finances? N  Housekeeping or managing your Housekeeping? N  Some recent data might be hidden    Patient Care Team: Susy Frizzle, MD as PCP - General (Family Medicine)  Indicate any recent Medical Services you may have received from other than [facility] providers in the past year (date may be approximate).     Assessment:   This is a routine wellness examination for Jillian Koch.  Hearing/Vision screen  Hearing Screening   125Hz  250Hz  500Hz  1000Hz  2000Hz  3000Hz  4000Hz  6000Hz  8000Hz   Right ear:           Left ear:           Vision Screening Comments: Gets eye exams once every six months. Has elevated pressures.   Dietary issues and exercise activities discussed: Current Exercise Habits: Home exercise routine, Type of exercise: walking, Time (Minutes): 30, Frequency (Times/Week): 7, Weekly Exercise (Minutes/Week): 210, Intensity: Moderate, Exercise limited by: None identified  Goals Addressed            This Visit's Progress   . Patient Stated       I would like to maintain my current health!      Depression Screen PHQ 2/9 Scores 07/10/2020 02/18/2020 05/03/2018 11/02/2017 04/13/2017 10/13/2016 09/29/2016  PHQ - 2 Score 0 0 0 0 0 0 0  PHQ- 9 Score - - - - - 0 0    Fall Risk Fall Risk  07/10/2020 02/18/2020 05/03/2018 11/02/2017 04/13/2017  Falls in the past year? 0 0 0 No No  Number falls in past yr: 0 0 - - -  Injury with Fall? 0 0 - - -  Risk for fall due to : No Fall Risks - - - -  Follow up Falls  evaluation completed;Falls prevention discussed - Falls evaluation completed - -    FALL RISK PREVENTION PERTAINING TO THE HOME:  Any stairs in or around the home? Yes  If so, are there any without handrails? No  Home free of loose throw rugs in walkways, pet beds, electrical cords, etc? Yes  Adequate lighting in your home to reduce risk of falls? Yes   ASSISTIVE DEVICES UTILIZED TO PREVENT FALLS:  Life alert? No  Use of a cane, walker or w/c? No  Grab bars in the bathroom? Yes  Shower chair or bench in shower? Yes  Elevated toilet seat or a handicapped toilet? Yes   TIMED UP AND GO:  Was the test performed? Yes .  Length of time to ambulate 10 feet: 3 sec.   Gait steady and fast without use of assistive device  Cognitive Function:   Normal cognitive status assessed by direct observation by this Nurse Health Advisor. No abnormalities found.        Immunizations Immunization History  Administered Date(s) Administered  . Moderna Sars-Covid-2 Vaccination 04/10/2019, 05/08/2019  . Pneumococcal Conjugate-13 05/03/2018  . Pneumococcal Polysaccharide-23 12/18/2006    TDAP status: Due, Education has been provided regarding the importance of this vaccine. Advised may receive this vaccine at local pharmacy or Health Dept. Aware to provide a copy of the vaccination record if obtained from local pharmacy or Health Dept. Verbalized acceptance and understanding.  Flu Vaccine status: Due, Education has been provided regarding the importance of this vaccine. Advised may receive this vaccine at local pharmacy or Health Dept. Aware to provide a copy of the vaccination record if obtained from local pharmacy or Health Dept. Verbalized acceptance and understanding.  Pneumococcal vaccine status: Up to date  Covid-19 vaccine status: Completed vaccines  Qualifies for Shingles Vaccine? Yes   Zostavax completed No   Shingrix Completed?: No.    Education has been provided regarding the  importance of this vaccine. Patient has been advised to call insurance company to determine out of pocket expense if they have not yet received this vaccine. Advised may also receive vaccine at local pharmacy or Health Dept. Verbalized acceptance and understanding.  Screening Tests Health Maintenance  Topic Date Due  . Zoster Vaccines- Shingrix (1 of 2) Never done  . COLONOSCOPY (Pts 45-68yrs Insurance coverage will need to be confirmed)  02/24/2019  . MAMMOGRAM  08/31/2019  . COVID-19 Vaccine (3 - Booster for Moderna series) 10/08/2019  . TETANUS/TDAP  02/17/2021 (Originally 07/14/1959)  . INFLUENZA VACCINE  09/14/2020  . DEXA SCAN  Completed  . Hepatitis C Screening  Completed  . PNA vac Low Risk Adult  Completed  . HPV VACCINES  Aged Out    Health Maintenance  Health Maintenance Due  Topic Date Due  . Zoster Vaccines- Shingrix (1 of 2) Never done  . COLONOSCOPY (Pts 45-37yrs Insurance coverage will need to be confirmed)  02/24/2019  . MAMMOGRAM  08/31/2019  . COVID-19 Vaccine (3 - Booster for Moderna series) 10/08/2019    Colorectal cancer screening: Referral to GI placed 07/09/2020. Pt aware the office will call re: appt.  Mammogram status: Ordered 07/10/2020. Pt provided with contact info and advised to call to schedule appt.   Bone Density status: Ordered 07/10/2020. Pt provided with contact info and advised to call to schedule appt.  Lung Cancer Screening: (Low Dose CT Chest recommended if Age 73-80 years, 30 pack-year currently smoking OR have quit w/in 15years.) does not qualify.   Lung Cancer Screening Referral: N/A   Additional Screening:  Hepatitis C Screening: does not qualify;   Vision Screening: Recommended annual ophthalmology exams for early detection of glaucoma and other disorders of the eye. Is the patient up to date with their annual eye exam?  Yes  Who is the provider or what is the name of the office in which the patient attends annual eye exams? Dr.  Satira Sark If pt is not established with a provider, would they like to be referred to a provider to establish care? No .   Dental Screening:  Recommended annual dental exams for proper oral hygiene  Community Resource Referral / Chronic Care Management: CRR required this visit?  No   CCM required this visit?  No      Plan:     I have personally reviewed and noted the following in the patient's chart:   . Medical and social history . Use of alcohol, tobacco or illicit drugs  . Current medications and supplements including opioid prescriptions.  . Functional ability and status . Nutritional status . Physical activity . Advanced directives . List of other physicians . Hospitalizations, surgeries, and ER visits in previous 12 months . Vitals . Screenings to include cognitive, depression, and falls . Referrals and appointments  In addition, I have reviewed and discussed with patient certain preventive protocols, quality metrics, and best practice recommendations. A written personalized care plan for preventive services as well as general preventive health recommendations were provided to patient.     Ofilia Neas, LPN   [id number]   Nurse Notes: None

## 2020-07-10 ENCOUNTER — Ambulatory Visit (INDEPENDENT_AMBULATORY_CARE_PROVIDER_SITE_OTHER): Payer: Medicare HMO

## 2020-07-10 ENCOUNTER — Other Ambulatory Visit: Payer: Self-pay

## 2020-07-10 VITALS — BP 130/52 | Temp 98.7°F | Ht 60.0 in | Wt 145.0 lb

## 2020-07-10 DIAGNOSIS — Z0000 Encounter for general adult medical examination without abnormal findings: Secondary | ICD-10-CM

## 2020-07-10 DIAGNOSIS — Z780 Asymptomatic menopausal state: Secondary | ICD-10-CM

## 2020-07-10 DIAGNOSIS — Z1231 Encounter for screening mammogram for malignant neoplasm of breast: Secondary | ICD-10-CM | POA: Diagnosis not present

## 2020-07-10 NOTE — Patient Instructions (Signed)
Jillian Koch , Thank you for taking time to come for your Medicare Wellness Visit. I appreciate your ongoing commitment to your health goals. Please review the following plan we discussed and let me know if I can assist you in the future.   Screening recommendations/referrals: Colonoscopy: No longer required  Mammogram: Currently due, orders placed this visit Bone Density: Currently due, orders placed this visit Recommended yearly ophthalmology/optometry visit for glaucoma screening and checkup Recommended yearly dental visit for hygiene and checkup  Vaccinations: Influenza vaccine: Patient declined  Pneumococcal vaccine: Completed series  Tdap vaccine: Up to date, next due 08/26/2020 Shingles vaccine: Currently due for Shingrix, if you would like to receive we recommend that you do so at your local pharmacy     Advanced directives: Advance directive discussed with you today. Even though you declined this today please call our office should you change your mind and we can give you the proper paperwork for you to fill out.   Conditions/risks identified: None   Next appointment: None   Preventive Care 65 Years and Older, Female Preventive care refers to lifestyle choices and visits with your health care provider that can promote health and wellness. What does preventive care include?  A yearly physical exam. This is also called an annual well check.  Dental exams once or twice a year.  Routine eye exams. Ask your health care provider how often you should have your eyes checked.  Personal lifestyle choices, including:  Daily care of your teeth and gums.  Regular physical activity.  Eating a healthy diet.  Avoiding tobacco and drug use.  Limiting alcohol use.  Practicing safe sex.  Taking low-dose aspirin every day.  Taking vitamin and mineral supplements as recommended by your health care provider. What happens during an annual well check? The services and screenings  done by your health care provider during your annual well check will depend on your age, overall health, lifestyle risk factors, and family history of disease. Counseling  Your health care provider may ask you questions about your:  Alcohol use.  Tobacco use.  Drug use.  Emotional well-being.  Home and relationship well-being.  Sexual activity.  Eating habits.  History of falls.  Memory and ability to understand (cognition).  Work and work Statistician.  Reproductive health. Screening  You may have the following tests or measurements:  Height, weight, and BMI.  Blood pressure.  Lipid and cholesterol levels. These may be checked every 5 years, or more frequently if you are over 33 years old.  Skin check.  Lung cancer screening. You may have this screening every year starting at age 80 if you have a 30-pack-year history of smoking and currently smoke or have quit within the past 15 years.  Fecal occult blood test (FOBT) of the stool. You may have this test every year starting at age 80.  Flexible sigmoidoscopy or colonoscopy. You may have a sigmoidoscopy every 5 years or a colonoscopy every 10 years starting at age 80.  Hepatitis C blood test.  Hepatitis B blood test.  Sexually transmitted disease (STD) testing.  Diabetes screening. This is done by checking your blood sugar (glucose) after you have not eaten for a while (fasting). You may have this done every 1-3 years.  Bone density scan. This is done to screen for osteoporosis. You may have this done starting at age 80.  Mammogram. This may be done every 1-2 years. Talk to your health care provider about how often you should have  regular mammograms. Talk with your health care provider about your test results, treatment options, and if necessary, the need for more tests. Vaccines  Your health care provider may recommend certain vaccines, such as:  Influenza vaccine. This is recommended every year.  Tetanus,  diphtheria, and acellular pertussis (Tdap, Td) vaccine. You may need a Td booster every 10 years.  Zoster vaccine. You may need this after age 85.  Pneumococcal 13-valent conjugate (PCV13) vaccine. One dose is recommended after age 80.  Pneumococcal polysaccharide (PPSV23) vaccine. One dose is recommended after age 80. Talk to your health care provider about which screenings and vaccines you need and how often you need them. This information is not intended to replace advice given to you by your health care provider. Make sure you discuss any questions you have with your health care provider. Document Released: 02/27/2015 Document Revised: 10/21/2015 Document Reviewed: 12/02/2014 Elsevier Interactive Patient Education  2017 [place] Prevention in the Home Falls can cause injuries. They can happen to people of all ages. There are many things you can do to make your home safe and to help prevent falls. What can I do on the outside of my home?  Regularly fix the edges of walkways and driveways and fix any cracks.  Remove anything that might make you trip as you walk through a door, such as a raised step or threshold.  Trim any bushes or trees on the path to your home.  Use bright outdoor lighting.  Clear any walking paths of anything that might make someone trip, such as rocks or tools.  Regularly check to see if handrails are loose or broken. Make sure that both sides of any steps have handrails.  Any raised decks and porches should have guardrails on the edges.  Have any leaves, snow, or ice cleared regularly.  Use sand or salt on walking paths during winter.  Clean up any spills in your garage right away. This includes oil or grease spills. What can I do in the bathroom?  Use night lights.  Install grab bars by the toilet and in the tub and shower. Do not use towel bars as grab bars.  Use non-skid mats or decals in the tub or shower.  If you need to sit down in  the shower, use a plastic, non-slip stool.  Keep the floor dry. Clean up any water that spills on the floor as soon as it happens.  Remove soap buildup in the tub or shower regularly.  Attach bath mats securely with double-sided non-slip rug tape.  Do not have throw rugs and other things on the floor that can make you trip. What can I do in the bedroom?  Use night lights.  Make sure that you have a light by your bed that is easy to reach.  Do not use any sheets or blankets that are too big for your bed. They should not hang down onto the floor.  Have a firm chair that has side arms. You can use this for support while you get dressed.  Do not have throw rugs and other things on the floor that can make you trip. What can I do in the kitchen?  Clean up any spills right away.  Avoid walking on wet floors.  Keep items that you use a lot in easy-to-reach places.  If you need to reach something above you, use a strong step stool that has a grab bar.  Keep electrical cords out of the  way.  Do not use floor polish or wax that makes floors slippery. If you must use wax, use non-skid floor wax.  Do not have throw rugs and other things on the floor that can make you trip. What can I do with my stairs?  Do not leave any items on the stairs.  Make sure that there are handrails on both sides of the stairs and use them. Fix handrails that are broken or loose. Make sure that handrails are as long as the stairways.  Check any carpeting to make sure that it is firmly attached to the stairs. Fix any carpet that is loose or worn.  Avoid having throw rugs at the top or bottom of the stairs. If you do have throw rugs, attach them to the floor with carpet tape.  Make sure that you have a light switch at the top of the stairs and the bottom of the stairs. If you do not have them, ask someone to add them for you. What else can I do to help prevent falls?  Wear shoes that:  Do not have high  heels.  Have rubber bottoms.  Are comfortable and fit you well.  Are closed at the toe. Do not wear sandals.  If you use a stepladder:  Make sure that it is fully opened. Do not climb a closed stepladder.  Make sure that both sides of the stepladder are locked into place.  Ask someone to hold it for you, if possible.  Clearly mark and make sure that you can see:  Any grab bars or handrails.  First and last steps.  Where the edge of each step is.  Use tools that help you move around (mobility aids) if they are needed. These include:  Canes.  Walkers.  Scooters.  Crutches.  Turn on the lights when you go into a dark area. Replace any light bulbs as soon as they burn out.  Set up your furniture so you have a clear path. Avoid moving your furniture around.  If any of your floors are uneven, fix them.  If there are any pets around you, be aware of where they are.  Review your medicines with your doctor. Some medicines can make you feel dizzy. This can increase your chance of falling. Ask your doctor what other things that you can do to help prevent falls. This information is not intended to replace advice given to you by your health care provider. Make sure you discuss any questions you have with your health care provider. Document Released: 11/27/2008 Document Revised: 07/09/2015 Document Reviewed: 03/07/2014 Elsevier Interactive Patient Education  2017 [employer].

## 2020-07-26 ENCOUNTER — Other Ambulatory Visit: Payer: Self-pay | Admitting: Family Medicine

## 2020-07-26 DIAGNOSIS — I10 Essential (primary) hypertension: Secondary | ICD-10-CM

## 2020-08-01 ENCOUNTER — Other Ambulatory Visit: Payer: Self-pay | Admitting: Family Medicine

## 2020-08-01 DIAGNOSIS — I10 Essential (primary) hypertension: Secondary | ICD-10-CM

## 2020-08-05 ENCOUNTER — Telehealth: Payer: Self-pay | Admitting: Family Medicine

## 2020-08-05 DIAGNOSIS — I10 Essential (primary) hypertension: Secondary | ICD-10-CM

## 2020-08-05 MED ORDER — AMLODIPINE BESYLATE 10 MG PO TABS: 1.0000 | ORAL_TABLET | Freq: Every day | ORAL | 1 refills | Status: DC

## 2020-08-05 NOTE — Telephone Encounter (Signed)
Pt called to check on refill of amLODipine (NORVASC) 10 MG tablet being sent to pharmacy pt is completely out of this med.

## 2020-08-05 NOTE — Telephone Encounter (Signed)
Prescription sent to pharmacy.

## 2020-08-21 ENCOUNTER — Ambulatory Visit (INDEPENDENT_AMBULATORY_CARE_PROVIDER_SITE_OTHER): Payer: Medicare HMO | Admitting: Family Medicine

## 2020-08-21 ENCOUNTER — Other Ambulatory Visit: Payer: Self-pay

## 2020-08-21 ENCOUNTER — Encounter: Payer: Self-pay | Admitting: Family Medicine

## 2020-08-21 VITALS — BP 132/88 | HR 72 | Temp 98.4°F | Resp 14 | Ht 60.0 in | Wt 146.0 lb

## 2020-08-21 DIAGNOSIS — Z780 Asymptomatic menopausal state: Secondary | ICD-10-CM

## 2020-08-21 DIAGNOSIS — Z0001 Encounter for general adult medical examination with abnormal findings: Secondary | ICD-10-CM

## 2020-08-21 DIAGNOSIS — Z0000 Encounter for general adult medical examination without abnormal findings: Secondary | ICD-10-CM

## 2020-08-21 DIAGNOSIS — I10 Essential (primary) hypertension: Secondary | ICD-10-CM

## 2020-08-21 DIAGNOSIS — Z1231 Encounter for screening mammogram for malignant neoplasm of breast: Secondary | ICD-10-CM

## 2020-08-21 MED ORDER — AMLODIPINE BESYLATE 10 MG PO TABS: 10.0000 mg | ORAL_TABLET | Freq: Every day | ORAL | 1 refills | Status: DC

## 2020-08-21 NOTE — Progress Notes (Signed)
Subjective:    Patient ID: Jillian Koch, female    DOB: [birth year], 80 y.o.   MRN: [id number] Patient is a very pleasant 80 year old Caucasian female who is here today for an annual wellness exam.  Her most recent lab work was drawn in January and is listed below.  I reviewed the lab work with her which includes a normal CBC normal CMP normal hepatitis screen as well as an excellent cholesterol panel.  She denies any concerns.  She has not taken amlodipine in several months yet her blood pressure is still excellent for her age at 80/80.  She denies any chest pain shortness of breath or dyspnea on exertion.  She denies any falls.  She denies any memory loss.  She denies any depression.  Immunizations are up-to-date except for the shingles vaccine..  She declines the shingles vaccine or the COVID-vaccine.  She is not due for a colonoscopy or Pap smear due to her age but she is due for a mammogram and a bone density test No visits with results within 1 Month(s) from this visit.  Latest known visit with results is:  Lab on 02/17/2020  Component Date Value Ref Range Status   Hepatitis C Ab 02/17/2020 NON-REACTIVE  NON-REACTI Final   SIGNAL TO CUT-OFF 02/17/2020 0.01  <1.00 Final   Comment: . HCV antibody was non-reactive. There is no laboratory  evidence of HCV infection. . In most cases, no further action is required. However, if recent HCV exposure is suspected, a test for HCV RNA (test code [id number]) is suggested. . For additional information please refer to http://education.[facility].com/faq/FAQ22v1 (This link is being provided for informational/ educational purposes only.) .    Cholesterol 02/17/2020 165  <200 mg/dL Final   HDL 02/17/2020 64  > OR = 50 mg/dL Final   Triglycerides 02/17/2020 82  <150 mg/dL Final   LDL Cholesterol (Calc) 02/17/2020 84  mg/dL (calc) Final   Comment: Reference range: <100 . Desirable range <100 mg/dL for primary prevention;   <70 mg/dL for  patients with CHD or diabetic patients  with > or = 2 CHD risk factors. Marland Kitchen LDL-C is now calculated using the Strollo-Hopkins  calculation, which is a validated novel method providing  better accuracy than the Friedewald equation in the  estimation of LDL-C.  Cresenciano Genre et al. Annamaria Helling. 5093;267(12): 2061-2068  (http://education.[facility].com/faq/FAQ164)    Total CHOL/HDL Ratio 02/17/2020 2.6  <5.0 (calc) Final   Non-HDL Cholesterol (Calc) 02/17/2020 101  <130 mg/dL (calc) Final   Comment: For patients with diabetes plus 1 major ASCVD risk  factor, treating to a non-HDL-C goal of <100 mg/dL  (LDL-C of <70 mg/dL) is considered a therapeutic  option.    WBC 02/17/2020 7.4  3.8 - 10.8 Thousand/uL Final   RBC 02/17/2020 4.36  3.80 - 5.10 Million/uL Final   Hemoglobin 02/17/2020 13.7  11.7 - 15.5 g/dL Final   HCT 02/17/2020 40.6  35.0 - 45.0 % Final   MCV 02/17/2020 93.1  80.0 - 100.0 fL Final   MCH 02/17/2020 31.4  27.0 - 33.0 pg Final   MCHC 02/17/2020 33.7  32.0 - 36.0 g/dL Final   RDW 02/17/2020 12.5  11.0 - 15.0 % Final   Platelets 02/17/2020 240  140 - 400 Thousand/uL Final   MPV 02/17/2020 9.6  7.5 - 12.5 fL Final   Neutro Abs 02/17/2020 3,966  1,500 - 7,800 cells/uL Final   Lymphs Abs 02/17/2020 2,694  850 - 3,900 cells/uL Final   Absolute  Monocytes 02/17/2020 607  200 - 950 cells/uL Final   Eosinophils Absolute 02/17/2020 81  15 - 500 cells/uL Final   Basophils Absolute 02/17/2020 52  0 - 200 cells/uL Final   Neutrophils Relative % 02/17/2020 53.6  % Final   Total Lymphocyte 02/17/2020 36.4  % Final   Monocytes Relative 02/17/2020 8.2  % Final   Eosinophils Relative 02/17/2020 1.1  % Final   Basophils Relative 02/17/2020 0.7  % Final   Glucose, Bld 02/17/2020 100 (A) 65 - 99 mg/dL Final   Comment: .            Fasting reference interval . For someone without known diabetes, a glucose value between 100 and 125 mg/dL is consistent with prediabetes and should be confirmed  with a follow-up test. .    BUN 02/17/2020 20  7 - 25 mg/dL Final   Creat 02/17/2020 0.79  0.60 - 0.93 mg/dL Final   Comment: For patients >69 years of age, the reference limit for Creatinine is approximately 13% higher for people identified as African-American. .    GFR, Est Non African American 02/17/2020 71  > OR = 60 mL/min/1.66m2 Final   GFR, Est African American 02/17/2020 83  > OR = 60 mL/min/1.66m2 Final   BUN/Creatinine Ratio 73/41/9379 NOT APPLICABLE  6 - 22 (calc) Final   Sodium 02/17/2020 142  135 - 146 mmol/L Final   Potassium 02/17/2020 3.8  3.5 - 5.3 mmol/L Final   Chloride 02/17/2020 105  98 - 110 mmol/L Final   CO2 02/17/2020 28  20 - 32 mmol/L Final   Calcium 02/17/2020 9.7  8.6 - 10.4 mg/dL Final   Total Protein 02/17/2020 6.9  6.1 - 8.1 g/dL Final   Albumin 02/17/2020 4.3  3.6 - 5.1 g/dL Final   Globulin 02/17/2020 2.6  1.9 - 3.7 g/dL (calc) Final   AG Ratio 02/17/2020 1.7  1.0 - 2.5 (calc) Final   Total Bilirubin 02/17/2020 0.4  0.2 - 1.2 mg/dL Final   Alkaline phosphatase (APISO) 02/17/2020 59  37 - 153 U/L Final   AST 02/17/2020 18  10 - 35 U/L Final   ALT 02/17/2020 16  6 - 29 U/L Final    Past Medical History:  Diagnosis Date   Hypertension    Past Surgical History:  Procedure Laterality Date   ABDOMINAL HYSTERECTOMY     APPENDECTOMY     Current Outpatient Medications on File Prior to Visit  Medication Sig Dispense Refill   COD LIVER OIL PO Take by mouth.     fish oil-omega-3 fatty acids 1000 MG capsule Take 1,200 mg by mouth daily.     Ginkgo Biloba (GNP GINGKO BILOBA EXTRACT PO) Take by mouth.     hydrochlorothiazide (HYDRODIURIL) 25 MG tablet Take 1 tablet by mouth once daily 90 tablet 0   Multiple Vitamins-Minerals (PRESERVISION AREDS PO) Take by mouth.     rosuvastatin (CRESTOR) 10 MG tablet Take 1 tablet by mouth once daily 90 tablet 0   vitamin C (ASCORBIC ACID) 500 MG tablet Take 500 mg by mouth daily.     zinc gluconate 50 MG tablet  Take 50 mg by mouth daily.     No current facility-administered medications on file prior to visit.   Allergies  Allergen Reactions   Penicillins Rash   Social History   Socioeconomic History   Marital status: Married    Spouse name: Not on file   Number of children: Not on file   Years  of education: Not on file   Highest education level: Not on file  Occupational History   Not on file  Tobacco Use   Smoking status: Never   Smokeless tobacco: Never  Vaping Use   Vaping Use: Never used  Substance and Sexual Activity   Alcohol use: Never   Drug use: Never   Sexual activity: Not on file  Other Topics Concern   Not on file  Social History Narrative   Not on file   Social Determinants of Health   Financial Resource Strain: Low Risk    Difficulty of Paying Living Expenses: Not hard at all  Food Insecurity: No Food Insecurity   Worried About Running Out of Food in the Last Year: Never true   [place] in the Last Year: Never true  Transportation Needs: No Transportation Needs   Lack of Transportation (Medical): No   Lack of Transportation (Non-Medical): No  Physical Activity: Sufficiently Active   Days of Exercise per Week: 7 days   Minutes of Exercise per Session: 30 min  Stress: No Stress Concern Present   Feeling of Stress : Not at all  Social Connections: Moderately Integrated   Frequency of Communication with Friends and Family: More than three times a week   Frequency of Social Gatherings with Friends and Family: More than three times a week   Attends Religious Services: More than 4 times per year   Active Member of [employer] or Organizations: No   Attends Archivist Meetings: Never   Marital Status: Married  Human resources officer Violence: Not At Risk   Fear of Current or Ex-Partner: No   Emotionally Abused: No   Physically Abused: No   Sexually Abused: No    Review of Systems  All other systems reviewed and are negative.     Objective:    Physical Exam Vitals reviewed.  Constitutional:      Appearance: Normal appearance.  Cardiovascular:     Rate and Rhythm: Normal rate and regular rhythm.     Heart sounds: Murmur heard.    No friction rub. No gallop.  Pulmonary:     Effort: Pulmonary effort is normal. No respiratory distress.     Breath sounds: Normal breath sounds. No wheezing, rhonchi or rales.  Abdominal:     General: Bowel sounds are normal. There is no distension.     Palpations: Abdomen is soft.     Tenderness: There is no abdominal tenderness. There is no guarding.  Musculoskeletal:     Right lower leg: No edema.     Left lower leg: No edema.  Neurological:     Mental Status: She is alert.          Assessment & Plan:  Encounter for Medicare annual wellness exam  Essential hypertension - Plan: amLODipine (NORVASC) 10 MG tablet  Screening mammogram for breast cancer - Plan: MM Digital Screening  Post-menopausal - Plan: DG Bone Density Physical exam today is normal.  Lab work from January is outstanding.  I recommended a COVID-vaccine along with a shingles shot which she declined.  I recommended a bone density test and a mammogram which is scheduled.  She denies any falls or depression or memory loss.  Blood pressure today is acceptable despite the fact she has not taken amlodipine for more than 2 months.  Therefore I recommended the patient refrain from using amlodipine and monitor blood pressure closely.  If her blood pressure rises greater than 140/90 on a consistent  basis, I would resume amlodipine.

## 2020-08-24 ENCOUNTER — Other Ambulatory Visit: Payer: Self-pay

## 2020-08-24 ENCOUNTER — Other Ambulatory Visit: Payer: Medicare HMO

## 2020-08-24 DIAGNOSIS — I10 Essential (primary) hypertension: Secondary | ICD-10-CM | POA: Diagnosis not present

## 2020-08-24 DIAGNOSIS — E785 Hyperlipidemia, unspecified: Secondary | ICD-10-CM | POA: Diagnosis not present

## 2020-08-25 LAB — CBC WITH DIFFERENTIAL/PLATELET
Absolute Monocytes: 624 cells/uL (ref 200–950)
Basophils Absolute: 52 cells/uL (ref 0–200)
Basophils Relative: 0.8 %
Eosinophils Absolute: 91 cells/uL (ref 15–500)
Eosinophils Relative: 1.4 %
HCT: 39.3 % (ref 35.0–45.0)
Hemoglobin: 13.1 g/dL (ref 11.7–15.5)
Lymphs Abs: 2509 cells/uL (ref 850–3900)
MCH: 31.4 pg (ref 27.0–33.0)
MCHC: 33.3 g/dL (ref 32.0–36.0)
MCV: 94.2 fL (ref 80.0–100.0)
MPV: 9.9 fL (ref 7.5–12.5)
Monocytes Relative: 9.6 %
Neutro Abs: 3224 cells/uL (ref 1500–7800)
Neutrophils Relative %: 49.6 %
Platelets: 247 10*3/uL (ref 140–400)
RBC: 4.17 10*6/uL (ref 3.80–5.10)
RDW: 12.8 % (ref 11.0–15.0)
Total Lymphocyte: 38.6 %
WBC: 6.5 10*3/uL (ref 3.8–10.8)

## 2020-08-25 LAB — COMPLETE METABOLIC PANEL WITH GFR
AG Ratio: 1.8 (calc) (ref 1.0–2.5)
ALT: 17 U/L (ref 6–29)
AST: 16 U/L (ref 10–35)
Albumin: 4.1 g/dL (ref 3.6–5.1)
Alkaline phosphatase (APISO): 62 U/L (ref 37–153)
BUN: 17 mg/dL (ref 7–25)
CO2: 31 mmol/L (ref 20–32)
Calcium: 9.5 mg/dL (ref 8.6–10.4)
Chloride: 104 mmol/L (ref 98–110)
Creat: 0.74 mg/dL (ref 0.60–0.95)
Globulin: 2.3 g/dL (calc) (ref 1.9–3.7)
Glucose, Bld: 95 mg/dL (ref 65–99)
Potassium: 3.8 mmol/L (ref 3.5–5.3)
Sodium: 140 mmol/L (ref 135–146)
Total Bilirubin: 0.5 mg/dL (ref 0.2–1.2)
Total Protein: 6.4 g/dL (ref 6.1–8.1)
eGFR: 82 mL/min/{1.73_m2} (ref >=60)

## 2020-08-25 LAB — LIPID PANEL
Cholesterol: 161 mg/dL (ref <200)
HDL: 63 mg/dL (ref >=50)
LDL Cholesterol (Calc): 79 mg/dL (calc)
Non-HDL Cholesterol (Calc): 98 mg/dL (calc) (ref <130)
Total CHOL/HDL Ratio: 2.6 (calc) (ref <5.0)
Triglycerides: 100 mg/dL (ref <150)

## 2020-08-27 ENCOUNTER — Encounter: Payer: Self-pay | Admitting: *Deleted

## 2020-09-22 ENCOUNTER — Other Ambulatory Visit: Payer: Self-pay | Admitting: Family Medicine

## 2020-10-04 ENCOUNTER — Other Ambulatory Visit: Payer: Self-pay | Admitting: Family Medicine

## 2020-10-09 ENCOUNTER — Telehealth: Payer: Self-pay

## 2020-10-09 MED ORDER — ROSUVASTATIN CALCIUM 10 MG PO TABS: 10.0000 mg | ORAL_TABLET | Freq: Every day | ORAL | 3 refills | Status: DC

## 2020-10-09 MED ORDER — HYDROCHLOROTHIAZIDE 25 MG PO TABS: 25.0000 mg | ORAL_TABLET | Freq: Every day | ORAL | 3 refills | Status: DC

## 2020-10-09 NOTE — Telephone Encounter (Signed)
Pt called in requesting a refill of hydrochlorothiazide (HYDRODIURIL) 25 MG tablet, and rosuvastatin (CRESTOR) 10 MG tablet. Please advise  Cb#: [phone]

## 2020-10-09 NOTE — Telephone Encounter (Signed)
Prescription sent to pharmacy.

## 2020-11-30 ENCOUNTER — Ambulatory Visit (HOSPITAL_COMMUNITY)
Admission: RE | Admit: 2020-11-30 | Discharge: 2020-11-30 | Disposition: A | Discharge status: Home / Self Care | Payer: Medicare HMO | Source: Ambulatory Visit | Attending: Family Medicine | Admitting: Family Medicine

## 2020-11-30 ENCOUNTER — Other Ambulatory Visit: Payer: Self-pay

## 2020-11-30 DIAGNOSIS — Z780 Asymptomatic menopausal state: Secondary | ICD-10-CM | POA: Diagnosis not present

## 2020-11-30 DIAGNOSIS — M8588 Other specified disorders of bone density and structure, other site: Secondary | ICD-10-CM | POA: Diagnosis not present

## 2020-11-30 DIAGNOSIS — M810 Age-related osteoporosis without current pathological fracture: Secondary | ICD-10-CM | POA: Diagnosis not present

## 2020-12-03 ENCOUNTER — Other Ambulatory Visit: Payer: Self-pay

## 2020-12-03 DIAGNOSIS — I10 Essential (primary) hypertension: Secondary | ICD-10-CM

## 2020-12-03 MED ORDER — ALENDRONATE SODIUM 70 MG PO TABS: 70.0000 mg | ORAL_TABLET | ORAL | 2 refills | Status: DC

## 2020-12-12 DIAGNOSIS — E663 Overweight: Secondary | ICD-10-CM | POA: Diagnosis not present

## 2020-12-12 DIAGNOSIS — Z6826 Body mass index (BMI) 26.0-26.9, adult: Secondary | ICD-10-CM | POA: Diagnosis not present

## 2020-12-12 DIAGNOSIS — E785 Hyperlipidemia, unspecified: Secondary | ICD-10-CM | POA: Diagnosis not present

## 2020-12-12 DIAGNOSIS — M810 Age-related osteoporosis without current pathological fracture: Secondary | ICD-10-CM | POA: Diagnosis not present

## 2020-12-12 DIAGNOSIS — H409 Unspecified glaucoma: Secondary | ICD-10-CM | POA: Diagnosis not present

## 2020-12-12 DIAGNOSIS — I10 Essential (primary) hypertension: Secondary | ICD-10-CM | POA: Diagnosis not present

## 2020-12-12 DIAGNOSIS — Z7983 Long term (current) use of bisphosphonates: Secondary | ICD-10-CM | POA: Diagnosis not present

## 2020-12-12 DIAGNOSIS — Z823 Family history of stroke: Secondary | ICD-10-CM | POA: Diagnosis not present

## 2020-12-12 DIAGNOSIS — M1990 Unspecified osteoarthritis, unspecified site: Secondary | ICD-10-CM | POA: Diagnosis not present

## 2020-12-21 IMAGING — MG DIGITAL SCREENING BILATERAL MAMMOGRAM WITH TOMO AND CAD
6 of 10 series · 6 of 30 positions shown · non-contrast
Comparison: Previous exam(s).

CLINICAL DATA: Screening.

EXAM:
DIGITAL SCREENING BILATERAL MAMMOGRAM WITH TOMO AND CAD

[R CC synth-2D (1 of 2)]
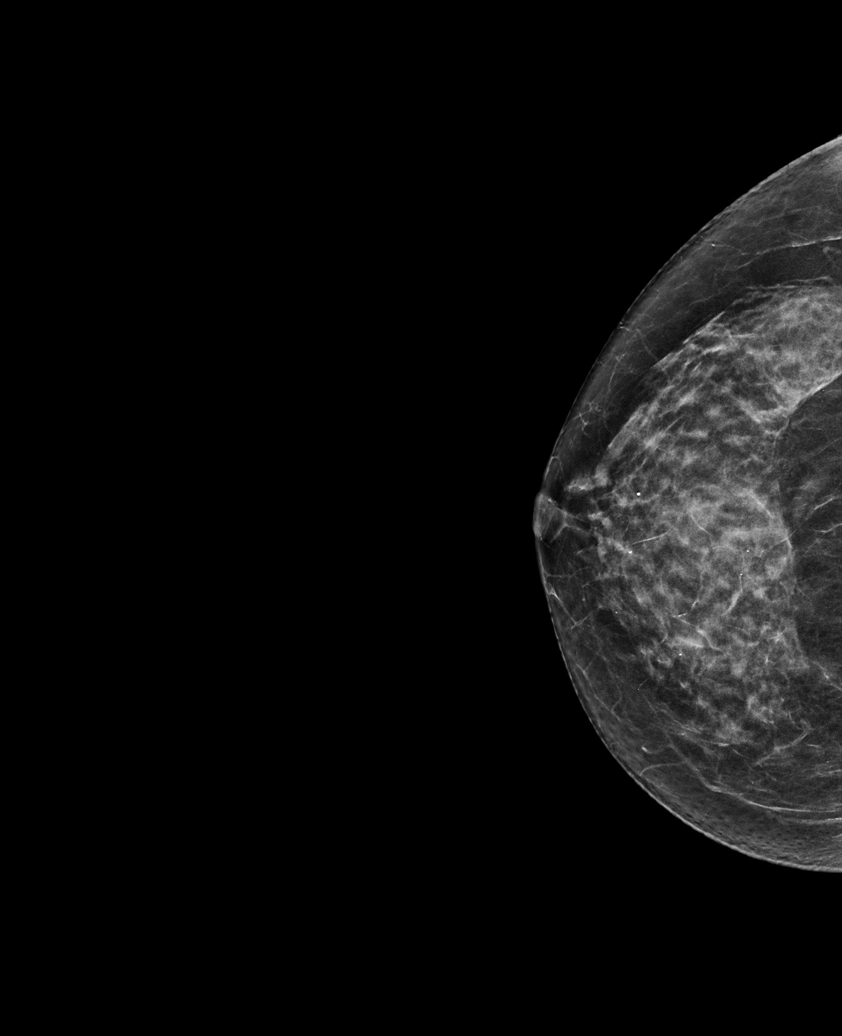

[L MLO synth-2D]
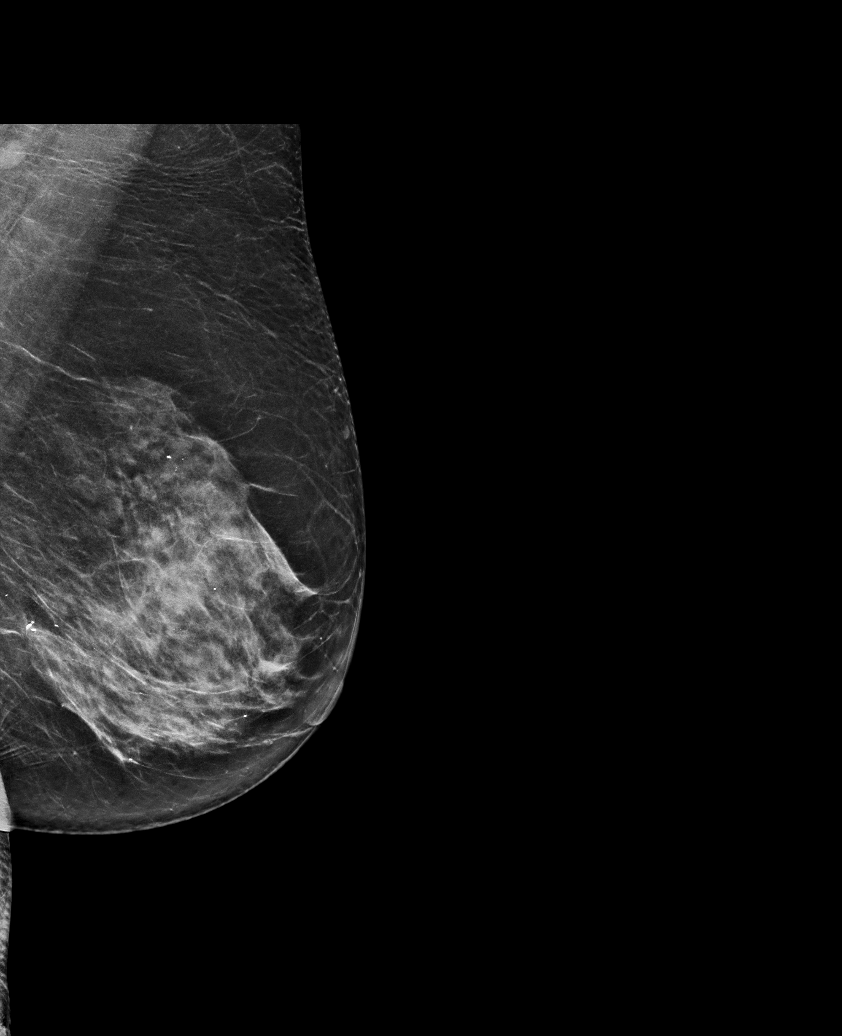

[R MLO synth-2D]
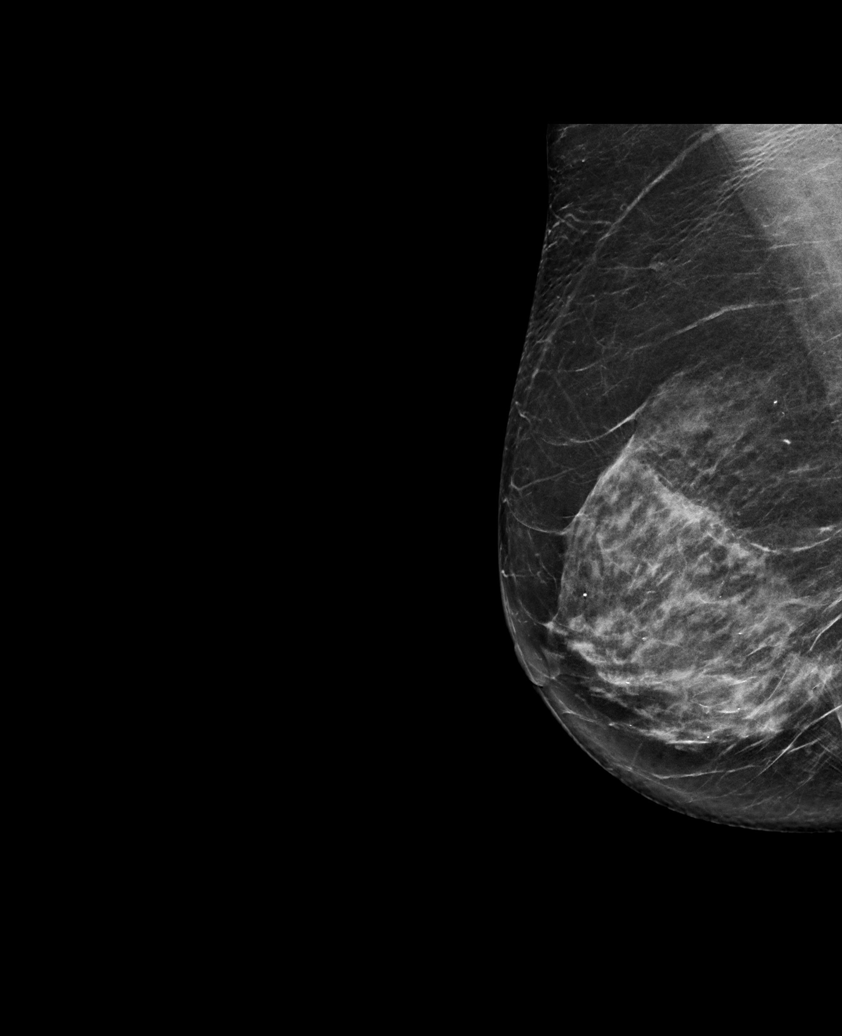

[R CC synth-2D (2 of 2)]
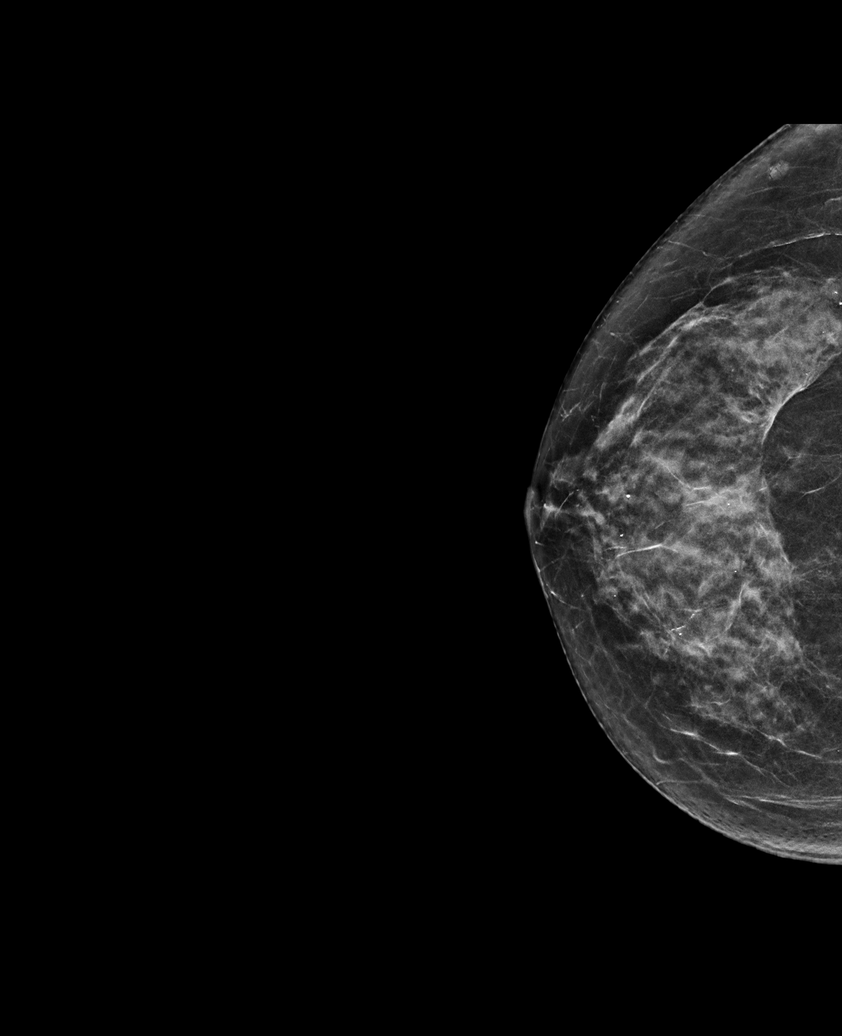

[L CC synth-2D]
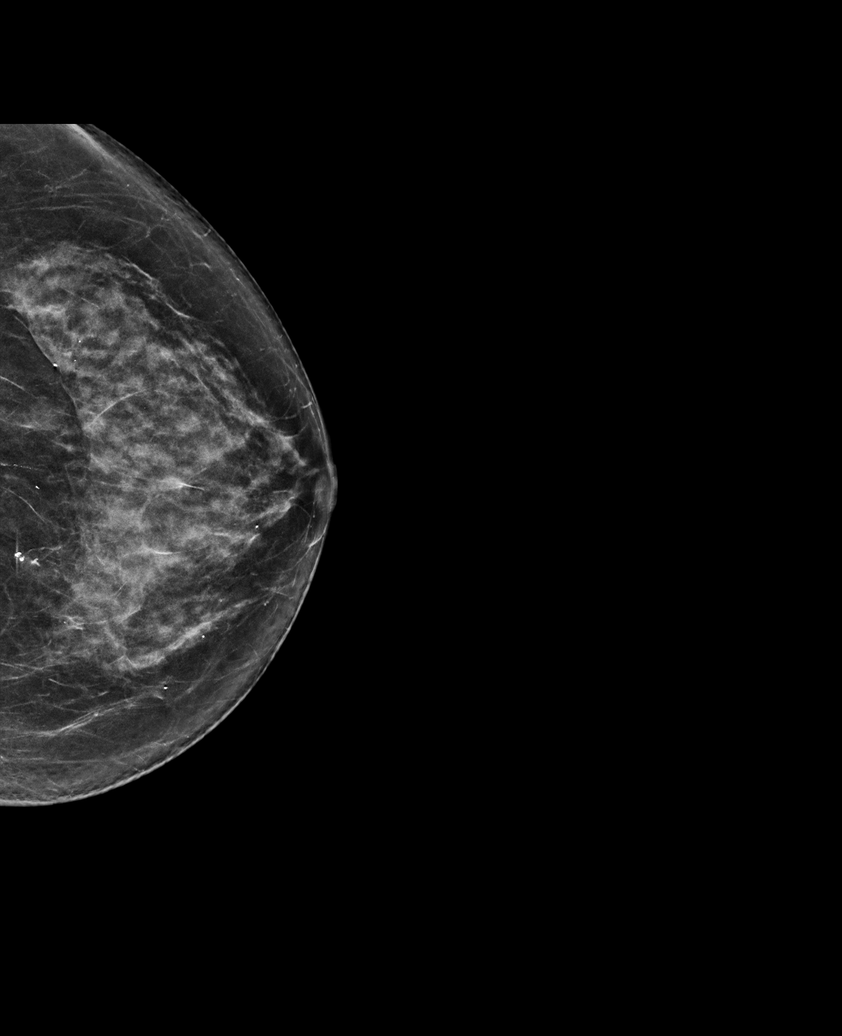

[L MLO tomo · tomo slice 37/73.0]
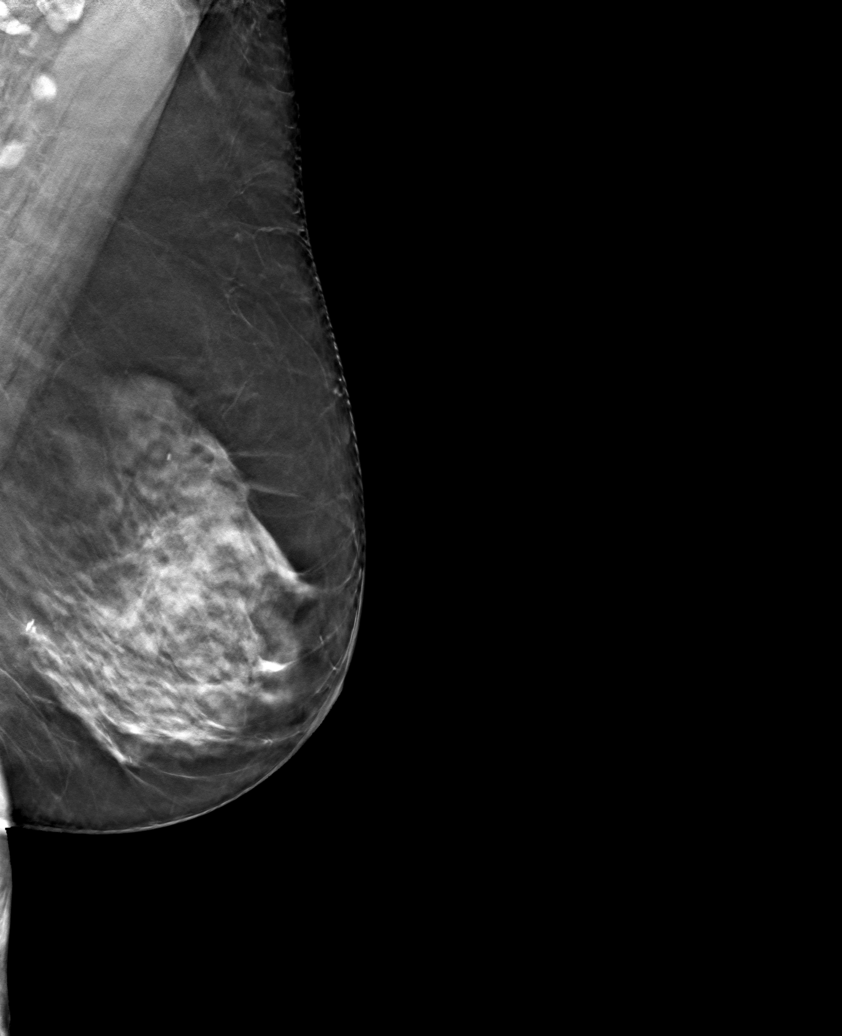

[6 of 30 positions shown; findings below may reference images not displayed]

ACR Breast Density Category c: The breast tissue is heterogeneously
dense, which may obscure small masses.
FINDINGS: There are no findings suspicious for malignancy. Images were
processed with CAD.
IMPRESSION: No mammographic evidence of malignancy. A result letter of this
screening mammogram will be mailed directly to the patient.

RECOMMENDATION:
Screening mammogram in one year. (Code:[id number])

BI-RADS CATEGORY  1: Negative.

## 2020-12-25 DIAGNOSIS — H401331 Pigmentary glaucoma, bilateral, mild stage: Secondary | ICD-10-CM | POA: Diagnosis not present

## 2021-02-22 ENCOUNTER — Ambulatory Visit: Payer: Medicare HMO | Admitting: Family Medicine

## 2021-04-28 DIAGNOSIS — H401331 Pigmentary glaucoma, bilateral, mild stage: Secondary | ICD-10-CM | POA: Diagnosis not present

## 2021-07-07 DIAGNOSIS — L821 Other seborrheic keratosis: Secondary | ICD-10-CM | POA: Diagnosis not present

## 2021-07-07 DIAGNOSIS — D2272 Melanocytic nevi of left lower limb, including hip: Secondary | ICD-10-CM | POA: Diagnosis not present

## 2021-07-07 DIAGNOSIS — L905 Scar conditions and fibrosis of skin: Secondary | ICD-10-CM | POA: Diagnosis not present

## 2021-07-07 DIAGNOSIS — D225 Melanocytic nevi of trunk: Secondary | ICD-10-CM | POA: Diagnosis not present

## 2021-07-07 DIAGNOSIS — L812 Freckles: Secondary | ICD-10-CM | POA: Diagnosis not present

## 2021-07-07 DIAGNOSIS — D224 Melanocytic nevi of scalp and neck: Secondary | ICD-10-CM | POA: Diagnosis not present

## 2021-07-07 DIAGNOSIS — D1801 Hemangioma of skin and subcutaneous tissue: Secondary | ICD-10-CM | POA: Diagnosis not present

## 2021-07-07 DIAGNOSIS — L723 Sebaceous cyst: Secondary | ICD-10-CM | POA: Diagnosis not present

## 2021-07-09 ENCOUNTER — Other Ambulatory Visit: Payer: Self-pay | Admitting: Family Medicine

## 2021-07-13 NOTE — Telephone Encounter (Signed)
Requested medication (s) are due for refill today: yes  Requested medication (s) are on the active medication list: yes  Last refill:  12/03/20 #12/2  Future visit scheduled: yes scheduled for 07/16/21  Notes to clinic:  Unable to refill per protocol due to failed labs, no updated results.    Requested Prescriptions  Pending Prescriptions Disp Refills   alendronate (FOSAMAX) 70 MG tablet [Pharmacy Med Name: Alendronate Sodium 70 MG Oral Tablet] 12 tablet 0    Sig: TAKE 1 TABLET BY MOUTH ONCE A WEEK. TAKE WITH A FULL GLASS OF WATER ON AN EMPTY STOMACH. SIT UP RIGHT FOR 30 MINUTES AFTER TAKING.     Endocrinology:  Bisphosphonates Failed - 07/09/2021  6:37 AM      Failed - Vitamin D in normal range and within 360 days    Vit D, 25-Hydroxy  Date Value Ref Range Status  09/29/2016 57 30 - 100 ng/mL Final    Comment:    Vitamin D Status           25-OH Vitamin D        Deficiency                <20 ng/mL        Insufficiency         20 - 29 ng/mL        Optimal             > or = 30 ng/mL   For 25-OH Vitamin D testing on patients on D2-supplementation and patients for whom quantitation of D2 and D3 fractions is required, the [facility] 25-OH VIT D, (D2,D3), LC/MS/MS is recommended: order code [id number] (patients > 2 yrs).          Failed - Mg Level in normal range and within 360 days    No results found for: MG       Failed - Phosphate in normal range and within 360 days    No results found for: PHOS       Failed - Valid encounter within last 12 months    Recent Outpatient Visits           10 months ago Encounter for Medicare annual wellness exam   [place] Pickard, Cammie Mcgee, MD   1 year ago Pure hypercholesterolemia   [place] Pickard, Cammie Mcgee, MD   1 year ago Pure hypercholesterolemia   [place] [facility], Cammie Mcgee, MD   2 years ago Benign essential HTN   [place] Pickard, Cammie Mcgee, MD   3 years  ago Essential hypertension   [place], Cammie Mcgee, MD       Future Appointments             In 3 days Pickard, Cammie Mcgee, MD [place], [place] in normal range and within 360 days    Calcium  Date Value Ref Range Status  08/24/2020 9.5 8.6 - 10.4 mg/dL Final         Passed - Cr in normal range and within 360 days    Creat  Date Value Ref Range Status  08/24/2020 0.74 0.60 - 0.95 mg/dL Final         Passed - eGFR is 30 or above and within 360 days    GFR, Est African American  Date Value  Ref Range Status  02/17/2020 83 > OR = 60 mL/min/1.50m2 Final   GFR, Est Non African American  Date Value Ref Range Status  02/17/2020 71 > OR = 60 mL/min/1.20m2 Final   eGFR  Date Value Ref Range Status  08/24/2020 82 > OR = 60 mL/min/1.81m2 Final    Comment:    The eGFR is based on the CKD-EPI 2021 equation. To calculate  the new eGFR from a previous Creatinine or Cystatin C result, go to [email or web address]/[email or web address]          Passed - Bone Mineral Density or Dexa Scan completed in the last 2 years

## 2021-07-16 ENCOUNTER — Ambulatory Visit (INDEPENDENT_AMBULATORY_CARE_PROVIDER_SITE_OTHER): Payer: Medicare HMO | Admitting: Family Medicine

## 2021-07-16 ENCOUNTER — Ambulatory Visit (INDEPENDENT_AMBULATORY_CARE_PROVIDER_SITE_OTHER): Payer: Medicare HMO

## 2021-07-16 VITALS — BP 130/60 | HR 72 | Temp 97.8°F | Ht 61.0 in | Wt 146.0 lb

## 2021-07-16 VITALS — BP 124/76 | Ht 60.0 in | Wt 146.0 lb

## 2021-07-16 DIAGNOSIS — Z0000 Encounter for general adult medical examination without abnormal findings: Secondary | ICD-10-CM | POA: Diagnosis not present

## 2021-07-16 DIAGNOSIS — I10 Essential (primary) hypertension: Secondary | ICD-10-CM

## 2021-07-16 MED ORDER — ALENDRONATE SODIUM 70 MG PO TABS: ORAL_TABLET | ORAL | 3 refills | Status: DC

## 2021-07-16 NOTE — Progress Notes (Signed)
Subjective:    Patient ID: Jillian Koch, female    DOB: 05-14-1940, 81 y.o.   MRN: [id number] Patient has a history of hypertension for which she takes amlodipine and hydrochlorothiazide.  She denies any chest pain shortness of breath or dyspnea on exertion.  Her blood pressure today is well controlled at 130/60.  On her bone density test she was found to have osteoporosis and we started her on Fosamax.  She has completed 3 months of therapy and she stopped the medication because she was unaware that I wanted her to continue with.  I explained the role of Fosamax in preventing progression of osteoporosis and also explained that we would like her to continue the medication for up to 5 years tolerated with plans on rechecking bone density test every 3 years.  The patient is in agreement with this and requested prescription.  Otherwise she is doing well with no concerns.  She denies any myalgias right upper quadrant pain.  She denies any abdominal pain or nausea or vomiting.  She denies any melena or hematochezia   Past Medical History:  Diagnosis Date   Hypertension    Past Surgical History:  Procedure Laterality Date   ABDOMINAL HYSTERECTOMY     APPENDECTOMY     Current Outpatient Medications on File Prior to Visit  Medication Sig Dispense Refill   amLODipine (NORVASC) 10 MG tablet Take 1 tablet (10 mg total) by mouth daily. 90 tablet 1   COD LIVER OIL PO Take by mouth.     fish oil-omega-3 fatty acids 1000 MG capsule Take 1,200 mg by mouth daily.     Ginkgo Biloba (GNP GINGKO BILOBA EXTRACT PO) Take by mouth.     hydrochlorothiazide (HYDRODIURIL) 25 MG tablet Take 1 tablet (25 mg total) by mouth daily. 90 tablet 3   latanoprost (XALATAN) 0.005 % ophthalmic solution 1 drop at bedtime.     Multiple Vitamins-Minerals (PRESERVISION AREDS PO) Take by mouth.     rosuvastatin (CRESTOR) 10 MG tablet Take 1 tablet (10 mg total) by mouth daily. 90 tablet 3   vitamin C (ASCORBIC ACID) 500 MG  tablet Take 500 mg by mouth daily.     zinc gluconate 50 MG tablet Take 50 mg by mouth daily.     No current facility-administered medications on file prior to visit.   Allergies  Allergen Reactions   Penicillins Rash   Social History   Socioeconomic History   Marital status: Married    Spouse name: Mortimer Fries   Number of children: 1   Years of education: Not on file   Highest education level: Not on file  Occupational History   Not on file  Tobacco Use   Smoking status: Never   Smokeless tobacco: Never  Vaping Use   Vaping Use: Never used  Substance and Sexual Activity   Alcohol use: Never   Drug use: Never   Sexual activity: Yes  Other Topics Concern   Not on file  Social History Narrative   Married x 56 years 07/2021.   1 adopted son.   Social Determinants of Health   Financial Resource Strain: Low Risk    Difficulty of Paying Living Expenses: Not hard at all  Food Insecurity: No Food Insecurity   Worried About Charity fundraiser in the Last Year: Never true   Sulligent in the Last Year: Never true  Transportation Needs: No Transportation Needs   Lack of Transportation (Medical): No  Lack of Transportation (Non-Medical): No  Physical Activity: Sufficiently Active   Days of Exercise per Week: 5 days   Minutes of Exercise per Session: 30 min  Stress: No Stress Concern Present   Feeling of Stress : Not at all  Social Connections: Socially Integrated   Frequency of Communication with Friends and Family: More than three times a week   Frequency of Social Gatherings with Friends and Family: More than three times a week   Attends Religious Services: More than 4 times per year   Active Member of [employer] or Organizations: Yes   Attends Music therapist: More than 4 times per year   Marital Status: Married  Human resources officer Violence: Not At Risk   Fear of Current or Ex-Partner: No   Emotionally Abused: No   Physically Abused: No   Sexually Abused: No     Review of Systems  All other systems reviewed and are negative.     Objective:   Physical Exam Vitals reviewed.  Constitutional:      Appearance: Normal appearance.  Cardiovascular:     Rate and Rhythm: Normal rate and regular rhythm.     Heart sounds: Murmur heard.    No friction rub. No gallop.  Pulmonary:     Effort: Pulmonary effort is normal. No respiratory distress.     Breath sounds: Normal breath sounds. No wheezing, rhonchi or rales.  Abdominal:     General: Bowel sounds are normal. There is no distension.     Palpations: Abdomen is soft.     Tenderness: There is no abdominal tenderness. There is no guarding.  Musculoskeletal:     Right lower leg: No edema.     Left lower leg: No edema.  Neurological:     Mental Status: She is alert.          Assessment & Plan:  Essential hypertension - Plan: CBC with Differential/Platelet, Lipid panel, COMPLETE METABOLIC PANEL WITH GFR Patient's blood pressure is outstanding.  Her physical exam today is normal.  Continue amlodipine and hydrochlorothiazide.  Check a CBC, CMP, and a lipid panel.  Monitor her renal function and her liver function test.  Ideally I like to see her LDL cholesterol below 100 but I would except an LDL cholesterol less than 130.  Repeat a bone density test in 2 years.  Continue Fosamax for a total of 5 years.

## 2021-07-16 NOTE — Patient Instructions (Signed)
Jillian Koch , Thank you for taking time to come for your Medicare Wellness Visit. I appreciate your ongoing commitment to your health goals. Please review the following plan we discussed and let me know if I can assist you in the future.   Screening recommendations/referrals: Colonoscopy: Done 05/08/2019 Repeat every 7 years.  Mammogram: Done 08/31/2018 Repeat annually  Bone Density: Done 11/30/2020 Repeat every 2 years  Recommended yearly ophthalmology/optometry visit for glaucoma screening and checkup Recommended yearly dental visit for hygiene and checkup  Vaccinations: Influenza vaccine: Declined. Pneumococcal vaccine: Done 05/03/2018 and 12/18/2006 Tdap vaccine: Due every 10 years.  Shingles vaccine: Discussed. Available at local pharmacy.    Covid-19:Done 04/10/2019 and 05/08/2019  Advanced directives: Please bring a copy of your health care power of attorney and living will to the office to be added to your chart at your convenience.   Conditions/risks identified: KEEP UP THE GOOD WORK!!  Next appointment: Follow up in one year for your annual wellness visit 2024.8    Preventive Care 65 Years and Older, Female Preventive care refers to lifestyle choices and visits with your health care provider that can promote health and wellness. What does preventive care include? A yearly physical exam. This is also called an annual well check. Dental exams once or twice a year. Routine eye exams. Ask your health care provider how often you should have your eyes checked. Personal lifestyle choices, including: Daily care of your teeth and gums. Regular physical activity. Eating a healthy diet. Avoiding tobacco and drug use. Limiting alcohol use. Practicing safe sex. Taking low-dose aspirin every day. Taking vitamin and mineral supplements as recommended by your health care provider. What happens during an annual well check? The services and screenings done by your health care provider  during your annual well check will depend on your age, overall health, lifestyle risk factors, and family history of disease. Counseling  Your health care provider may ask you questions about your: Alcohol use. Tobacco use. Drug use. Emotional well-being. Home and relationship well-being. Sexual activity. Eating habits. History of falls. Memory and ability to understand (cognition). Work and work Statistician. Reproductive health. Screening  You may have the following tests or measurements: Height, weight, and BMI. Blood pressure. Lipid and cholesterol levels. These may be checked every 5 years, or more frequently if you are over 10 years old. Skin check. Lung cancer screening. You may have this screening every year starting at age 29 if you have a 30-pack-year history of smoking and currently smoke or have quit within the past 15 years. Fecal occult blood test (FOBT) of the stool. You may have this test every year starting at age 33. Flexible sigmoidoscopy or colonoscopy. You may have a sigmoidoscopy every 5 years or a colonoscopy every 10 years starting at age 29. Hepatitis C blood test. Hepatitis B blood test. Sexually transmitted disease (STD) testing. Diabetes screening. This is done by checking your blood sugar (glucose) after you have not eaten for a while (fasting). You may have this done every 1-3 years. Bone density scan. This is done to screen for osteoporosis. You may have this done starting at age 56. Mammogram. This may be done every 1-2 years. Talk to your health care provider about how often you should have regular mammograms. Talk with your health care provider about your test results, treatment options, and if necessary, the need for more tests. Vaccines  Your health care provider may recommend certain vaccines, such as: Influenza vaccine. This is recommended every  year. Tetanus, diphtheria, and acellular pertussis (Tdap, Td) vaccine. You may need a Td booster every  10 years. Zoster vaccine. You may need this after age 65. Pneumococcal 13-valent conjugate (PCV13) vaccine. One dose is recommended after age 54. Pneumococcal polysaccharide (PPSV23) vaccine. One dose is recommended after age 40. Talk to your health care provider about which screenings and vaccines you need and how often you need them. This information is not intended to replace advice given to you by your health care provider. Make sure you discuss any questions you have with your health care provider. Document Released: 02/27/2015 Document Revised: 10/21/2015 Document Reviewed: 12/02/2014 Elsevier Interactive Patient Education  2017 [place] Prevention in the Home Falls can cause injuries. They can happen to people of all ages. There are many things you can do to make your home safe and to help prevent falls. What can I do on the outside of my home? Regularly fix the edges of walkways and driveways and fix any cracks. Remove anything that might make you trip as you walk through a door, such as a raised step or threshold. Trim any bushes or trees on the path to your home. Use bright outdoor lighting. Clear any walking paths of anything that might make someone trip, such as rocks or tools. Regularly check to see if handrails are loose or broken. Make sure that both sides of any steps have handrails. Any raised decks and porches should have guardrails on the edges. Have any leaves, snow, or ice cleared regularly. Use sand or salt on walking paths during winter. Clean up any spills in your garage right away. This includes oil or grease spills. What can I do in the bathroom? Use night lights. Install grab bars by the toilet and in the tub and shower. Do not use towel bars as grab bars. Use non-skid mats or decals in the tub or shower. If you need to sit down in the shower, use a plastic, non-slip stool. Keep the floor dry. Clean up any water that spills on the floor as soon as it  happens. Remove soap buildup in the tub or shower regularly. Attach bath mats securely with double-sided non-slip rug tape. Do not have throw rugs and other things on the floor that can make you trip. What can I do in the bedroom? Use night lights. Make sure that you have a light by your bed that is easy to reach. Do not use any sheets or blankets that are too big for your bed. They should not hang down onto the floor. Have a firm chair that has side arms. You can use this for support while you get dressed. Do not have throw rugs and other things on the floor that can make you trip. What can I do in the kitchen? Clean up any spills right away. Avoid walking on wet floors. Keep items that you use a lot in easy-to-reach places. If you need to reach something above you, use a strong step stool that has a grab bar. Keep electrical cords out of the way. Do not use floor polish or wax that makes floors slippery. If you must use wax, use non-skid floor wax. Do not have throw rugs and other things on the floor that can make you trip. What can I do with my stairs? Do not leave any items on the stairs. Make sure that there are handrails on both sides of the stairs and use them. Fix handrails that are broken or  loose. Make sure that handrails are as long as the stairways. Check any carpeting to make sure that it is firmly attached to the stairs. Fix any carpet that is loose or worn. Avoid having throw rugs at the top or bottom of the stairs. If you do have throw rugs, attach them to the floor with carpet tape. Make sure that you have a light switch at the top of the stairs and the bottom of the stairs. If you do not have them, ask someone to add them for you. What else can I do to help prevent falls? Wear shoes that: Do not have high heels. Have rubber bottoms. Are comfortable and fit you well. Are closed at the toe. Do not wear sandals. If you use a stepladder: Make sure that it is fully opened.  Do not climb a closed stepladder. Make sure that both sides of the stepladder are locked into place. Ask someone to hold it for you, if possible. Clearly mark and make sure that you can see: Any grab bars or handrails. First and last steps. Where the edge of each step is. Use tools that help you move around (mobility aids) if they are needed. These include: Canes. Walkers. Scooters. Crutches. Turn on the lights when you go into a dark area. Replace any light bulbs as soon as they burn out. Set up your furniture so you have a clear path. Avoid moving your furniture around. If any of your floors are uneven, fix them. If there are any pets around you, be aware of where they are. Review your medicines with your doctor. Some medicines can make you feel dizzy. This can increase your chance of falling. Ask your doctor what other things that you can do to help prevent falls. This information is not intended to replace advice given to you by your health care provider. Make sure you discuss any questions you have with your health care provider. Document Released: 11/27/2008 Document Revised: 07/09/2015 Document Reviewed: 03/07/2014 Elsevier Interactive Patient Education  2017 [employer].

## 2021-07-16 NOTE — Progress Notes (Signed)
Subjective:   Jillian Koch is a 81 y.o. female who presents for Medicare Annual (Subsequent) preventive examination.  Chriss Driver, LPN  Review of Systems     Cardiac Risk Factors include: advanced age (>11mn, >>16women);hypertension;dyslipidemia     Objective:    Today's Vitals   07/16/21 1113  BP: 124/76  Weight: 146 lb (66.2 kg)  Height: 5' (1.524 m)   Body mass index is 28.51 kg/m.     07/16/2021   11:23 AM 07/10/2020   11:11 AM 02/05/2018    4:24 PM  Advanced Directives  Does Patient Have a Medical Advance Directive? No No Yes  Type of AScientist, physiologicalof [place]um[place] will  Does patient want to make changes to medical advance directive?   No - Patient declined  Copy of [place] Chart?   No - copy requested  Would patient like information on creating a medical advance directive? No - Patient declined No - Patient declined No - Patient declined    Current Medications (verified) Outpatient Encounter Medications as of 07/16/2021  Medication Sig   alendronate (FOSAMAX) 70 MG tablet TAKE 1 TABLET BY MOUTH ONCE A WEEK. TAKE WITH A FULL GLASS OF WATER ON AN EMPTY STOMACH. SIT UP RIGHT FOR 30 MINUTES AFTER TAKING.   amLODipine (NORVASC) 10 MG tablet Take 1 tablet (10 mg total) by mouth daily.   COD LIVER OIL PO Take by mouth.   fish oil-omega-3 fatty acids 1000 MG capsule Take 1,200 mg by mouth daily.   Ginkgo Biloba (GNP GINGKO BILOBA EXTRACT PO) Take by mouth.   hydrochlorothiazide (HYDRODIURIL) 25 MG tablet Take 1 tablet (25 mg total) by mouth daily.   latanoprost (XALATAN) 0.005 % ophthalmic solution 1 drop at bedtime.   Multiple Vitamins-Minerals (PRESERVISION AREDS PO) Take by mouth.   rosuvastatin (CRESTOR) 10 MG tablet Take 1 tablet (10 mg total) by mouth daily.   vitamin C (ASCORBIC ACID) 500 MG tablet Take 500 mg by mouth daily.   zinc gluconate 50 MG tablet Take 50 mg by mouth daily.   No  facility-administered encounter medications on file as of 07/16/2021.    Allergies (verified) Penicillins   History: Past Medical History:  Diagnosis Date   Hypertension    Past Surgical History:  Procedure Laterality Date   ABDOMINAL HYSTERECTOMY     APPENDECTOMY     Family History  Problem Relation Age of Onset   Colon cancer Maternal Aunt    Breast cancer Sister    Social History   Socioeconomic History   Marital status: Married    Spouse name: BMortimer Fries  Number of children: 1   Years of education: Not on file   Highest education level: Not on file  Occupational History   Not on file  Tobacco Use   Smoking status: Never   Smokeless tobacco: Never  Vaping Use   Vaping Use: Never used  Substance and Sexual Activity   Alcohol use: Never   Drug use: Never   Sexual activity: Yes  Other Topics Concern   Not on file  Social History Narrative   Married x 56 years 07/2021.   1 adopted son.   Social Determinants of Health   Financial Resource Strain: Low Risk    Difficulty of Paying Living Expenses: Not hard at all  Food Insecurity: No Food Insecurity   Worried About RCharity fundraiserin the Last Year: Never true  Ran Out of Food in the Last Year: Never true  Transportation Needs: No Transportation Needs   Lack of Transportation (Medical): No   Lack of Transportation (Non-Medical): No  Physical Activity: Sufficiently Active   Days of Exercise per Week: 5 days   Minutes of Exercise per Session: 30 min  Stress: No Stress Concern Present   Feeling of Stress : Not at all  Social Connections: Socially Integrated   Frequency of Communication with Friends and Family: More than three times a week   Frequency of Social Gatherings with Friends and Family: More than three times a week   Attends Religious Services: More than 4 times per year   Active Member of [employer] or Organizations: Yes   Attends Music therapist: More than 4 times per year   Marital Status:  Married    Tobacco Counseling Counseling given: Not Answered   Clinical Intake:  Pre-visit preparation completed: Yes  Pain : No/denies pain     BMI - recorded: 28.51 Nutritional Status: BMI 25 -29 Overweight Nutritional Risks: None Diabetes: No  How often do you need to have someone help you when you read instructions, pamphlets, or other written materials from your doctor or pharmacy?: 1 - Never  Diabetic?NO  Interpreter Needed?: No  Information entered by :: mj Nya Monds, lpn   Activities of Daily Living    07/16/2021   11:24 AM  In your present state of health, do you have any difficulty performing the following activities:  Hearing? 0  Vision? 0  Difficulty concentrating or making decisions? 0  Walking or climbing stairs? 0  Dressing or bathing? 0  Doing errands, shopping? 0  Preparing Food and eating ? N  Using the Toilet? N  In the past six months, have you accidently leaked urine? N  Do you have problems with loss of bowel control? N  Managing your Medications? N  Managing your Finances? N  Housekeeping or managing your Housekeeping? N    Patient Care Team: Susy Frizzle, MD as PCP - General (Family Medicine)  Indicate any recent Medical Services you may have received from other than [facility] providers in the past year (date may be approximate).     Assessment:   This is a routine wellness examination for Jillian Koch.  Hearing/Vision screen Hearing Screening - Comments:: No hearing issues.  Vision Screening - Comments:: Readers Dr. Satira Sark 08/2020  Dietary issues and exercise activities discussed: Current Exercise Habits: Home exercise routine, Type of exercise: walking, Time (Minutes): 30, Frequency (Times/Week): 5, Weekly Exercise (Minutes/Week): 150, Intensity: Mild, Exercise limited by: cardiac condition(s)   Goals Addressed             This Visit's Progress    Patient Stated   On track    I would like to maintain my current health!        Depression Screen    07/16/2021   11:19 AM 07/10/2020   11:13 AM 02/18/2020    2:32 PM 05/03/2018    8:01 AM 11/02/2017    8:00 AM 04/13/2017    8:04 AM 10/13/2016    8:08 AM  PHQ 2/9 Scores  PHQ - 2 Score 0 0 0 0 0 0 0  PHQ- 9 Score       0    Fall Risk    07/16/2021   11:23 AM 07/10/2020   11:12 AM 02/18/2020    2:32 PM 05/03/2018    8:01 AM 11/02/2017    8:00 AM  Fall Risk   Falls in the past year? 0 0 0 0 No  Number falls in past yr: 0 0 0    Injury with Fall? 0 0 0    Risk for fall due to : No Fall Risks No Fall Risks     Follow up Falls prevention discussed Falls evaluation completed;Falls prevention discussed  Falls evaluation completed     FALL RISK PREVENTION PERTAINING TO THE HOME:  Any stairs in or around the home? Yes  If so, are there any without handrails? No  Home free of loose throw rugs in walkways, pet beds, electrical cords, etc? Yes  Adequate lighting in your home to reduce risk of falls? Yes   ASSISTIVE DEVICES UTILIZED TO PREVENT FALLS:  Life alert? No  Use of a cane, walker or w/c? No  Grab bars in the bathroom? Yes  Shower chair or bench in shower? Yes  Elevated toilet seat or a handicapped toilet? Yes   TIMED UP AND GO:  Was the test performed? Yes .  Length of time to ambulate 10 feet: 8 sec.   Gait steady and fast without use of assistive device  Cognitive Function:        07/16/2021   11:24 AM  6CIT Screen  What Year? 0 points  What month? 0 points  What time? 0 points  Count back from 20 0 points  Months in reverse 0 points  Repeat phrase 0 points  Total Score 0 points    Immunizations Immunization History  Administered Date(s) Administered   Moderna Sars-Covid-2 Vaccination 04/10/2019, 05/08/2019   Pneumococcal Conjugate-13 05/03/2018   Pneumococcal Polysaccharide-23 12/18/2006    TDAP status: Due, Education has been provided regarding the importance of this vaccine. Advised may receive this vaccine at local pharmacy or  Health Dept. Aware to provide a copy of the vaccination record if obtained from local pharmacy or Health Dept. Verbalized acceptance and understanding.  Flu Vaccine status: Declined, Education has been provided regarding the importance of this vaccine but patient still declined. Advised may receive this vaccine at local pharmacy or Health Dept. Aware to provide a copy of the vaccination record if obtained from local pharmacy or Health Dept. Verbalized acceptance and understanding.  Pneumococcal vaccine status: Up to date  Covid-19 vaccine status: Completed vaccines  Qualifies for Shingles Vaccine? Yes   Zostavax completed No   Shingrix Completed?: No.    Education has been provided regarding the importance of this vaccine. Patient has been advised to call insurance company to determine out of pocket expense if they have not yet received this vaccine. Advised may also receive vaccine at local pharmacy or Health Dept. Verbalized acceptance and understanding.  Screening Tests Health Maintenance  Topic Date Due   MAMMOGRAM  08/31/2019   COVID-19 Vaccine (3 - Booster for Moderna series) 08/01/2021 (Originally 07/03/2019)   TETANUS/TDAP  02/11/2022 (Originally 07/14/1959)   Zoster Vaccines- Shingrix (1 of 2) 02/11/2022 (Originally 07/14/1990)   COLONOSCOPY (Pts 45-39yr Insurance coverage will need to be confirmed)  07/17/2022 (Originally 02/24/2019)   INFLUENZA VACCINE  09/14/2021   Pneumonia Vaccine 81 Years old  Completed   DEXA SCAN  Completed   HPV VACCINES  Aged Out    Health Maintenance  Health Maintenance Due  Topic Date Due   MAMMOGRAM  08/31/2019    Colorectal cancer screening: No longer required.   Mammogram status: No longer required due to AGE.  Bone Density status: Completed 11/30/2020. Results reflect: Bone density results: OSTEOPOROSIS.  Repeat every 2 years.  Lung Cancer Screening: (Low Dose CT Chest recommended if Age 31-80 years, 30 pack-year currently smoking OR have  quit w/in 15years.) does not qualify.  Additional Screening:  Hepatitis C Screening: does not qualify;   Vision Screening: Recommended annual ophthalmology exams for early detection of glaucoma and other disorders of the eye. Is the patient up to date with their annual eye exam?  Yes  Who is the provider or what is the name of the office in which the patient attends annual eye exams? Dr. Satira Sark If pt is not established with a provider, would they like to be referred to a provider to establish care? No .   Dental Screening: Recommended annual dental exams for proper oral hygiene  Community Resource Referral / Chronic Care Management: CRR required this visit?  No   CCM required this visit?  No      Plan:     I have personally reviewed and noted the following in the patient's chart:   Medical and social history Use of alcohol, tobacco or illicit drugs  Current medications and supplements including opioid prescriptions.  Functional ability and status Nutritional status Physical activity Advanced directives List of other physicians Hospitalizations, surgeries, and ER visits in previous 12 months Vitals Screenings to include cognitive, depression, and falls Referrals and appointments  In addition, I have reviewed and discussed with patient certain preventive protocols, quality metrics, and best practice recommendations. A written personalized care plan for preventive services as well as general preventive health recommendations were provided to patient.     Chriss Driver, LPN   04/17/1960   Nurse Notes: Discussed Shingrix and how to obtain.

## 2021-07-17 LAB — COMPLETE METABOLIC PANEL WITHOUT GFR
AG Ratio: 1.6 (calc) (ref 1.0–2.5)
ALT: 17 U/L (ref 6–29)
AST: 18 U/L (ref 10–35)
Albumin: 4.2 g/dL (ref 3.6–5.1)
Alkaline phosphatase (APISO): 50 U/L (ref 37–153)
BUN: 14 mg/dL (ref 7–25)
CO2: 28 mmol/L (ref 20–32)
Calcium: 10.1 mg/dL (ref 8.6–10.4)
Chloride: 103 mmol/L (ref 98–110)
Creat: 0.91 mg/dL (ref 0.60–0.95)
Globulin: 2.7 g/dL (ref 1.9–3.7)
Glucose, Bld: 96 mg/dL (ref 65–99)
Potassium: 4.1 mmol/L (ref 3.5–5.3)
Sodium: 139 mmol/L (ref 135–146)
Total Bilirubin: 0.4 mg/dL (ref 0.2–1.2)
Total Protein: 6.9 g/dL (ref 6.1–8.1)
eGFR: 63 mL/min/1.73m2

## 2021-07-17 LAB — LIPID PANEL
Cholesterol: 162 mg/dL (ref <200)
HDL: 66 mg/dL (ref >=50)
LDL Cholesterol (Calc): 76 mg/dL (calc)
Non-HDL Cholesterol (Calc): 96 mg/dL (calc) (ref <130)
Total CHOL/HDL Ratio: 2.5 (calc) (ref <5.0)
Triglycerides: 123 mg/dL (ref <150)

## 2021-07-17 LAB — CBC WITH DIFFERENTIAL/PLATELET
Absolute Monocytes: 689 {cells}/uL (ref 200–950)
Basophils Absolute: 59 {cells}/uL (ref 0–200)
Basophils Relative: 0.7 %
Eosinophils Absolute: 151 {cells}/uL (ref 15–500)
Eosinophils Relative: 1.8 %
HCT: 39.6 % (ref 35.0–45.0)
Hemoglobin: 13.4 g/dL (ref 11.7–15.5)
Lymphs Abs: 3116 {cells}/uL (ref 850–3900)
MCH: 31.6 pg (ref 27.0–33.0)
MCHC: 33.8 g/dL (ref 32.0–36.0)
MCV: 93.4 fL (ref 80.0–100.0)
MPV: 10 fL (ref 7.5–12.5)
Monocytes Relative: 8.2 %
Neutro Abs: 4385 {cells}/uL (ref 1500–7800)
Neutrophils Relative %: 52.2 %
Platelets: 242 Thousand/uL (ref 140–400)
RBC: 4.24 Million/uL (ref 3.80–5.10)
RDW: 12.4 % (ref 11.0–15.0)
Total Lymphocyte: 37.1 %
WBC: 8.4 Thousand/uL (ref 3.8–10.8)

## 2021-07-19 ENCOUNTER — Ambulatory Visit: Payer: Self-pay

## 2021-07-19 ENCOUNTER — Ambulatory Visit
Admission: EM | Admit: 2021-07-19 | Discharge: 2021-07-19 | Disposition: A | Discharge status: Home / Self Care | Payer: Medicare HMO | Attending: Nurse Practitioner | Admitting: Nurse Practitioner

## 2021-07-19 DIAGNOSIS — R399 Unspecified symptoms and signs involving the genitourinary system: Secondary | ICD-10-CM | POA: Diagnosis not present

## 2021-07-19 LAB — POCT URINALYSIS DIP (MANUAL ENTRY)
Bilirubin, UA: NEGATIVE
Glucose, UA: NEGATIVE mg/dL
Ketones, POC UA: NEGATIVE mg/dL
Nitrite, UA: NEGATIVE
Protein Ur, POC: NEGATIVE mg/dL
Spec Grav, UA: 1.01 (ref 1.010–1.025)
Urobilinogen, UA: 0.2 E.U./dL
pH, UA: 5 (ref 5.0–8.0)

## 2021-07-19 MED ORDER — DOXYCYCLINE HYCLATE 100 MG PO TABS: 100.0000 mg | ORAL_TABLET | Freq: Two times a day (BID) | ORAL | 0 refills | Status: DC

## 2021-07-19 NOTE — Discharge Instructions (Addendum)
Your urinalysis does show leukocytes.  A urine culture has been ordered for confirmatory testing..  If the urine culture is negative, you will be contacted and asked to stop the medications. Take medication as prescribed. Increase fluids.  You should be drinking approximately 6-8 8 ounce glasses of water while symptoms persist. Void or urinate every 2 hours. If you develop fever, chills, low back pain, or worsening symptoms after taking the medication, please follow-up in our clinic immediately.

## 2021-07-19 NOTE — ED Provider Notes (Signed)
[facility]    CSN: [id number] Arrival date & time: 07/19/21  1633      History   Chief Complaint Chief Complaint  Patient presents with   Urinary Frequency    HPI Jillian Koch is a 81 y.o. female.   The history is provided by the patient.   Patient presents for complaints of urinary frequency and dysuria that started 1 day ago.  She also complains of suprapubic pressure with urination.  She denies fever, chills, hesitancy, hematuria, or foul-smelling urine.  She states her last urinary tract infection was "a long time ago".  She has not taken any medication for her symptoms.  Past Medical History:  Diagnosis Date   Hypertension     Patient Active Problem List   Diagnosis Date Noted   Hypertension 09/29/2016   Osteoporosis 06/22/2007   Hyperlipidemia 01/29/2007   HEMORRHOIDS, INTERNAL 12/18/2006   ARTHRITIS 12/18/2006    Past Surgical History:  Procedure Laterality Date   ABDOMINAL HYSTERECTOMY     APPENDECTOMY      OB History     Gravida      Para      Term      Preterm      AB      Living  0      SAB      IAB      Ectopic      Multiple      Live Births               Home Medications    Prior to Admission medications   Medication Sig Start Date End Date Taking? Authorizing Provider  doxycycline (VIBRA-TABS) 100 MG tablet Take 1 tablet (100 mg total) by mouth 2 (two) times daily. 07/19/21  Yes Tea Collums-Warren, Alda Lea, NP  alendronate (FOSAMAX) 70 MG tablet TAKE 1 TABLET BY MOUTH ONCE A WEEK. TAKE WITH A FULL GLASS OF WATER ON AN EMPTY STOMACH. SIT UP RIGHT FOR 30 MINUTES AFTER TAKING. 07/16/21   Susy Frizzle, MD  amLODipine (NORVASC) 10 MG tablet Take 1 tablet (10 mg total) by mouth daily. 08/21/20   Susy Frizzle, MD  COD LIVER OIL PO Take by mouth.    [provider]  fish oil-omega-3 fatty acids 1000 MG capsule Take 1,200 mg by mouth daily.    [provider]  Ginkgo Biloba (GNP GINGKO BILOBA  EXTRACT PO) Take by mouth.    [provider]  hydrochlorothiazide (HYDRODIURIL) 25 MG tablet Take 1 tablet (25 mg total) by mouth daily. 10/09/20   Susy Frizzle, MD  latanoprost (XALATAN) 0.005 % ophthalmic solution 1 drop at bedtime. 12/17/20   [provider]  Multiple Vitamins-Minerals (PRESERVISION AREDS PO) Take by mouth.    [provider]  rosuvastatin (CRESTOR) 10 MG tablet Take 1 tablet (10 mg total) by mouth daily. 10/09/20   Susy Frizzle, MD  vitamin C (ASCORBIC ACID) 500 MG tablet Take 500 mg by mouth daily.    [provider]  zinc gluconate 50 MG tablet Take 50 mg by mouth daily.    [provider]    Family History Family History  Problem Relation Age of Onset   Colon cancer Maternal Aunt    Breast cancer Sister     Social History Social History   Tobacco Use   Smoking status: Never   Smokeless tobacco: Never  Vaping Use   Vaping Use: Never used  Substance Use Topics  Alcohol use: Never   Drug use: Never     Allergies   Penicillins   Review of Systems Review of Systems Per HPI  Physical Exam Triage Vital Signs ED Triage Vitals [07/19/21 1832]  Enc Vitals Group     BP (!) 163/70     Pulse Rate 69     Resp 18     Temp 98 F (36.7 C)     Temp Source Oral     SpO2 97 %     Weight      Height      Head Circumference      Peak Flow      Pain Score      Pain Loc      Pain Edu?      Excl. in [place]?    No data found.  Updated Vital Signs BP (!) 163/70 (BP Location: Right Arm)   Pulse 69   Temp 98 F (36.7 C) (Oral)   Resp 18   SpO2 97%   Visual Acuity Right Eye Distance:   Left Eye Distance:   Bilateral Distance:    Right Eye Near:   Left Eye Near:    Bilateral Near:     Physical Exam Vitals and nursing note reviewed.  Constitutional:      General: She is not in acute distress.    Appearance: She is well-developed.  Eyes:     Extraocular Movements: Extraocular movements intact.      Pupils: Pupils are equal, round, and reactive to light.  Cardiovascular:     Rate and Rhythm: Normal rate and regular rhythm.     Heart sounds: Normal heart sounds.  Pulmonary:     Effort: Pulmonary effort is normal.     Breath sounds: Normal breath sounds.  Abdominal:     General: Bowel sounds are normal. There is no distension.     Palpations: Abdomen is soft.     Tenderness: There is no abdominal tenderness. There is no right CVA tenderness, left CVA tenderness, guarding or rebound.  Genitourinary:    Vagina: Normal. No vaginal discharge.  Musculoskeletal:     Cervical back: Normal range of motion.  Skin:    General: Skin is warm and dry.     Findings: No erythema or rash.  Neurological:     General: No focal deficit present.     Mental Status: She is alert and oriented to person, place, and time.     Cranial Nerves: No cranial nerve deficit.  Psychiatric:        Mood and Affect: Mood normal.        Behavior: Behavior normal.     UC Treatments / Results  Labs (all labs ordered are listed, but only abnormal results are displayed) Labs Reviewed  POCT URINALYSIS DIP (MANUAL ENTRY) - Abnormal; Notable for the following components:      Result Value   Clarity, UA hazy (*)    Blood, UA trace-lysed (*)    Leukocytes, UA Small (1+) (*)    All other components within normal limits  URINE CULTURE    EKG   Radiology No results found.  Procedures Procedures (including critical care time)  Medications Ordered in UC Medications - No data to display  Initial Impression / Assessment and Plan / UC Course  I have reviewed the triage vital signs and the nursing notes.  Pertinent labs & imaging results that were available during my care of the patient were reviewed by me  and considered in my medical decision making (see chart for details).  Urinalysis is positive for small leukocytes and red.  Based on the patient's history and physical, she is experiencing urinary tract  infection symptoms.  Urine culture is pending at this time.  Supportive care recommendations were provided for the patient.  Strict return precautions were also provided.  Patient advised to follow-up as needed. Final Clinical Impressions(s) / UC Diagnoses   Final diagnoses:  Urinary tract infection symptoms     Discharge Instructions      Your urinalysis does show leukocytes.  A urine culture has been ordered for confirmatory testing..  If the urine culture is negative, you will be contacted and asked to stop the medications. Take medication as prescribed. Increase fluids.  You should be drinking approximately 6-8 8 ounce glasses of water while symptoms persist. Void or urinate every 2 hours. If you develop fever, chills, low back pain, or worsening symptoms after taking the medication, please follow-up in our clinic immediately.     ED Prescriptions     Medication Sig Dispense Auth. Provider   doxycycline (VIBRA-TABS) 100 MG tablet Take 1 tablet (100 mg total) by mouth 2 (two) times daily. 20 tablet Diamonte Stavely-Warren, Alda Lea, NP      PDMP not reviewed this encounter.   Tish Men, NP 07/19/21 1858

## 2021-07-19 NOTE — Telephone Encounter (Signed)
Chief Complaint: Urine frequency, pressure Symptoms: Urinating every 15 minutes and not a lot coming out, pressure rate 7-8 on pain scale Frequency: Onset yesterday Pertinent Negatives: Patient denies blood in urine, flank pain, fever, other symptoms Disposition: '[]'$ ED /'[x]'$ Urgent Care (no appt availability in office) / '[]'$ Appointment(In office/virtual)/ '[]'$  [facility]/ '[]'$ Home Care/ '[]'$ Refused Recommended Disposition /'[]'$ [facility]/ '[]'$  Follow-up with PCP Additional Notes: Advised to go to the [facility] on [address] Linna Hoff, patient verbalized understanding.    Pt called in stating that she has not been able to urinate today. Pt states that she feels pain and pressure near the lower part of her abdomen. Pt states that she has been trying to go all day. Please advise.    Reason for Disposition  Urinating more frequently than usual (i.e., frequency)  Answer Assessment - Initial Assessment Questions 1. SYMPTOM: "What's the main symptom you're concerned about?" (e.g., frequency, incontinence)     Frequency, pressure 2. ONSET: "When did the symptoms start?"     Yesterday 3. PAIN: "Is there any pain?" If Yes, ask: "How bad is it?" (Scale: 1-10; mild, moderate, severe)     7-8 4. CAUSE: "What do you think is causing the symptoms?"     Not sure 5. OTHER SYMPTOMS: "Do you have any other symptoms?" (e.g., fever, flank pain, blood in urine, pain with urination)     No 6. PREGNANCY: "Is there any chance you are pregnant?" "When was your last menstrual period?"     N/A  Protocols used: Urinary Symptoms-A-AH

## 2021-07-19 NOTE — ED Triage Notes (Signed)
Pt reports increase urinary frequency and burning when urinating x 1 day.

## 2021-07-21 LAB — URINE CULTURE: Culture: 50000 — AB

## 2021-07-26 ENCOUNTER — Telehealth: Payer: Self-pay

## 2021-07-26 NOTE — Telephone Encounter (Signed)
I left a message for the patient to return my call.

## 2021-07-26 NOTE — Telephone Encounter (Signed)
-----   Message from Worthy Rancher, MD sent at 07/19/2021  1:22 PM EDT ----- Hemoglobin and platelets and blood counts look good.  Cholesterol looks good.  Kidney function and blood sugar and liver function look good.  Covering provider Dr. Warrick Parisian

## 2021-08-23 ENCOUNTER — Ambulatory Visit (INDEPENDENT_AMBULATORY_CARE_PROVIDER_SITE_OTHER): Payer: Medicare HMO | Admitting: Family Medicine

## 2021-08-23 DIAGNOSIS — I10 Essential (primary) hypertension: Secondary | ICD-10-CM

## 2021-08-23 MED ORDER — DOXYCYCLINE HYCLATE 100 MG PO TABS: 100.0000 mg | ORAL_TABLET | Freq: Two times a day (BID) | ORAL | 0 refills | Status: DC

## 2021-08-23 MED ORDER — AMLODIPINE BESYLATE 10 MG PO TABS: 10.0000 mg | ORAL_TABLET | Freq: Every day | ORAL | 3 refills | Status: DC

## 2021-08-23 NOTE — Progress Notes (Signed)
Subjective:    Patient ID: Jillian Koch, female    DOB: 05-Jul-1940, 81 y.o.   MRN: [id number] Patient has a history of hypertension for which she takes amlodipine and hydrochlorothiazide.  She just had blood work less than a month ago that was outstanding.  She denies any chest pain shortness of breath or dyspnea on exertion.  She denies any abdominal pain, melena, hematochezia.  Her blood pressures are consistently less than 140/90.  She denies any ankle swelling on amlodipine.  She denies any cramps on hydrochlorothiazide. Past Medical History:  Diagnosis Date   Hypertension    Past Surgical History:  Procedure Laterality Date   ABDOMINAL HYSTERECTOMY     APPENDECTOMY     Current Outpatient Medications on File Prior to Visit  Medication Sig Dispense Refill   alendronate (FOSAMAX) 70 MG tablet TAKE 1 TABLET BY MOUTH ONCE A WEEK. TAKE WITH A FULL GLASS OF WATER ON AN EMPTY STOMACH. SIT UP RIGHT FOR 30 MINUTES AFTER TAKING. 12 tablet 3   COD LIVER OIL PO Take by mouth.     fish oil-omega-3 fatty acids 1000 MG capsule Take 1,200 mg by mouth daily.     Ginkgo Biloba (GNP GINGKO BILOBA EXTRACT PO) Take by mouth.     hydrochlorothiazide (HYDRODIURIL) 25 MG tablet Take 1 tablet (25 mg total) by mouth daily. 90 tablet 3   latanoprost (XALATAN) 0.005 % ophthalmic solution 1 drop at bedtime.     Multiple Vitamins-Minerals (PRESERVISION AREDS PO) Take by mouth.     rosuvastatin (CRESTOR) 10 MG tablet Take 1 tablet (10 mg total) by mouth daily. 90 tablet 3   vitamin C (ASCORBIC ACID) 500 MG tablet Take 500 mg by mouth daily.     zinc gluconate 50 MG tablet Take 50 mg by mouth daily.     No current facility-administered medications on file prior to visit.   Allergies  Allergen Reactions   Penicillins Rash   Social History   Socioeconomic History   Marital status: Married    Spouse name: Mortimer Fries   Number of children: 1   Years of education: Not on file   Highest education level: Not on  file  Occupational History   Not on file  Tobacco Use   Smoking status: Never   Smokeless tobacco: Never  Vaping Use   Vaping Use: Never used  Substance and Sexual Activity   Alcohol use: Never   Drug use: Never   Sexual activity: Yes  Other Topics Concern   Not on file  Social History Narrative   Married x 56 years 07/2021.   1 adopted son.   Social Determinants of Health   Financial Resource Strain: Low Risk  (07/16/2021)   Overall Financial Resource Strain (CARDIA)    Difficulty of Paying Living Expenses: Not hard at all  Food Insecurity: No Food Insecurity (07/16/2021)   Hunger Vital Sign    Worried About Running Out of Food in the Last Year: Never true    Ran Out of Food in the Last Year: Never true  Transportation Needs: No Transportation Needs (07/16/2021)   PRAPARE - Hydrologist (Medical): No    Lack of Transportation (Non-Medical): No  Physical Activity: Sufficiently Active (07/16/2021)   Exercise Vital Sign    Days of Exercise per Week: 5 days    Minutes of Exercise per Session: 30 min  Stress: No Stress Concern Present (07/16/2021)   [employer] -  Occupational Stress Questionnaire    Feeling of Stress : Not at all  Social Connections: Socially Integrated (07/16/2021)   Social Connection and Isolation Panel [NHANES]    Frequency of Communication with Friends and Family: More than three times a week    Frequency of Social Gatherings with Friends and Family: More than three times a week    Attends Religious Services: More than 4 times per year    Active Member of [employer] or Organizations: Yes    Attends Archivist Meetings: More than 4 times per year    Marital Status: Married  Human resources officer Violence: Not At Risk (07/16/2021)   Humiliation, Afraid, Rape, and Kick questionnaire    Fear of Current or Ex-Partner: No    Emotionally Abused: No    Physically Abused: No    Sexually Abused: No    Review of  Systems  All other systems reviewed and are negative.      Objective:   Physical Exam Vitals reviewed.  Constitutional:      Appearance: Normal appearance.  Cardiovascular:     Rate and Rhythm: Normal rate and regular rhythm.     Heart sounds: Murmur heard.     No friction rub. No gallop.  Pulmonary:     Effort: Pulmonary effort is normal. No respiratory distress.     Breath sounds: Normal breath sounds. No wheezing, rhonchi or rales.  Abdominal:     General: Bowel sounds are normal. There is no distension.     Palpations: Abdomen is soft.     Tenderness: There is no abdominal tenderness. There is no guarding.  Musculoskeletal:     Right lower leg: No edema.     Left lower leg: No edema.  Neurological:     Mental Status: She is alert.           Assessment & Plan:  Essential hypertension - Plan: amLODipine (NORVASC) 10 MG tablet Patient's blood pressure is outstanding.  Her physical exam today is normal.  Continue amlodipine and hydrochlorothiazide.

## 2021-08-26 DIAGNOSIS — Z0100 Encounter for examination of eyes and vision without abnormal findings: Secondary | ICD-10-CM | POA: Diagnosis not present

## 2021-08-26 DIAGNOSIS — H401331 Pigmentary glaucoma, bilateral, mild stage: Secondary | ICD-10-CM | POA: Diagnosis not present

## 2021-10-15 ENCOUNTER — Other Ambulatory Visit: Payer: Self-pay | Admitting: Family Medicine

## 2022-01-26 DIAGNOSIS — C44622 Squamous cell carcinoma of skin of right upper limb, including shoulder: Secondary | ICD-10-CM | POA: Diagnosis not present

## 2022-01-26 DIAGNOSIS — L821 Other seborrheic keratosis: Secondary | ICD-10-CM | POA: Diagnosis not present

## 2022-02-03 DIAGNOSIS — L0889 Other specified local infections of the skin and subcutaneous tissue: Secondary | ICD-10-CM | POA: Diagnosis not present

## 2022-02-03 DIAGNOSIS — L720 Epidermal cyst: Secondary | ICD-10-CM | POA: Diagnosis not present

## 2022-02-14 DIAGNOSIS — R69 Illness, unspecified: Secondary | ICD-10-CM | POA: Diagnosis not present

## 2022-02-25 DIAGNOSIS — H401331 Pigmentary glaucoma, bilateral, mild stage: Secondary | ICD-10-CM | POA: Diagnosis not present

## 2022-02-25 DIAGNOSIS — H04123 Dry eye syndrome of bilateral lacrimal glands: Secondary | ICD-10-CM | POA: Diagnosis not present

## 2022-05-30 ENCOUNTER — Encounter: Payer: Self-pay | Admitting: Family Medicine

## 2022-05-30 ENCOUNTER — Ambulatory Visit (INDEPENDENT_AMBULATORY_CARE_PROVIDER_SITE_OTHER): Payer: Medicare HMO | Admitting: Family Medicine

## 2022-05-30 VITALS — BP 142/80 | HR 73 | Ht 61.0 in | Wt 144.0 lb

## 2022-05-30 DIAGNOSIS — M810 Age-related osteoporosis without current pathological fracture: Secondary | ICD-10-CM

## 2022-05-30 DIAGNOSIS — E7800 Pure hypercholesterolemia, unspecified: Secondary | ICD-10-CM | POA: Diagnosis not present

## 2022-05-30 DIAGNOSIS — R739 Hyperglycemia, unspecified: Secondary | ICD-10-CM | POA: Diagnosis not present

## 2022-05-30 DIAGNOSIS — I10 Essential (primary) hypertension: Secondary | ICD-10-CM | POA: Diagnosis not present

## 2022-05-30 MED ORDER — HYDROCHLOROTHIAZIDE 25 MG PO TABS: 25.0000 mg | ORAL_TABLET | Freq: Every day | ORAL | 3 refills | Status: DC

## 2022-05-30 NOTE — Progress Notes (Signed)
Subjective:    Patient ID: Jillian Koch, female    DOB: 16-Jul-1940, 82 y.o.   MRN: [id number]  Patient has a history of hypertension for which she takes amlodipine and hydrochlorothiazide.  She ran out of the hydrochlorothiazide 1 month ago.  She checked her blood pressure at home last week and found to be 160 systolic.  I repeated her blood pressure here and found it to be 152 systolic.  She is taking amlodipine.  She denies any chest pain shortness of breath or dyspnea on exertion.  She quit taking her rosuvastatin.  She denies any side effects from medication such as myalgia.  She states that cholesterol is always been fine.  She is due to recheck her cholesterol now.  She is consistently taking Fosamax once a week.  She denies any falls, depression, or memory loss  Past Medical History:  Diagnosis Date   Hypertension    Past Surgical History:  Procedure Laterality Date   ABDOMINAL HYSTERECTOMY     APPENDECTOMY     Current Outpatient Medications on File Prior to Visit  Medication Sig Dispense Refill   alendronate (FOSAMAX) 70 MG tablet TAKE 1 TABLET BY MOUTH ONCE A WEEK. TAKE WITH A FULL GLASS OF WATER ON AN EMPTY STOMACH. SIT UP RIGHT FOR 30 MINUTES AFTER TAKING. 12 tablet 3   amLODipine (NORVASC) 10 MG tablet Take 1 tablet (10 mg total) by mouth daily. 90 tablet 3   COD LIVER OIL PO Take by mouth.     doxycycline (VIBRA-TABS) 100 MG tablet Take 1 tablet (100 mg total) by mouth 2 (two) times daily. 14 tablet 0   fish oil-omega-3 fatty acids 1000 MG capsule Take 1,200 mg by mouth daily.     Ginkgo Biloba (GNP GINGKO BILOBA EXTRACT PO) Take by mouth.     hydrochlorothiazide (HYDRODIURIL) 25 MG tablet Take 1 tablet by mouth once daily 90 tablet 0   latanoprost (XALATAN) 0.005 % ophthalmic solution 1 drop at bedtime.     Multiple Vitamins-Minerals (PRESERVISION AREDS PO) Take by mouth.     rosuvastatin (CRESTOR) 10 MG tablet Take 1 tablet (10 mg total) by mouth daily. 90 tablet 3    vitamin C (ASCORBIC ACID) 500 MG tablet Take 500 mg by mouth daily.     zinc gluconate 50 MG tablet Take 50 mg by mouth daily.     No current facility-administered medications on file prior to visit.   Allergies  Allergen Reactions   Penicillins Rash   Social History   Socioeconomic History   Marital status: Married    Spouse name: Reita Cliche   Number of children: 1   Years of education: Not on file   Highest education level: Not on file  Occupational History   Not on file  Tobacco Use   Smoking status: Never   Smokeless tobacco: Never  Vaping Use   Vaping Use: Never used  Substance and Sexual Activity   Alcohol use: Never   Drug use: Never   Sexual activity: Yes  Other Topics Concern   Not on file  Social History Narrative   Married x 56 years 07/2021.   1 adopted son.   Social Determinants of Health   Financial Resource Strain: Low Risk  (07/16/2021)   Overall Financial Resource Strain (CARDIA)    Difficulty of Paying Living Expenses: Not hard at all  Food Insecurity: No Food Insecurity (07/16/2021)   Hunger Vital Sign    Worried About Running Out of Food  in the Last Year: Never true    Ran Out of Food in the Last Year: Never true  Transportation Needs: No Transportation Needs (07/16/2021)   PRAPARE - Administrator, Civil Service (Medical): No    Lack of Transportation (Non-Medical): No  Physical Activity: Sufficiently Active (07/16/2021)   Exercise Vital Sign    Days of Exercise per Week: 5 days    Minutes of Exercise per Session: 30 min  Stress: No Stress Concern Present (07/16/2021)   [employer] of Occupational Health - Occupational Stress Questionnaire    Feeling of Stress : Not at all  Social Connections: Socially Integrated (07/16/2021)   Social Connection and Isolation Panel [NHANES]    Frequency of Communication with Friends and Family: More than three times a week    Frequency of Social Gatherings with Friends and Family: More than three times a  week    Attends Religious Services: More than 4 times per year    Active Member of [employer] or Organizations: Yes    Attends Banker Meetings: More than 4 times per year    Marital Status: Married  Catering manager Violence: Not At Risk (07/16/2021)   Humiliation, Afraid, Rape, and Kick questionnaire    Fear of Current or Ex-Partner: No    Emotionally Abused: No    Physically Abused: No    Sexually Abused: No    Review of Systems  All other systems reviewed and are negative.      Objective:   Physical Exam Vitals reviewed.  Constitutional:      Appearance: Normal appearance.  Cardiovascular:     Rate and Rhythm: Normal rate and regular rhythm.     Heart sounds: Murmur heard.     No friction rub. No gallop.  Pulmonary:     Effort: Pulmonary effort is normal. No respiratory distress.     Breath sounds: Normal breath sounds. No wheezing, rhonchi or rales.  Abdominal:     General: Bowel sounds are normal. There is no distension.     Palpations: Abdomen is soft.     Tenderness: There is no abdominal tenderness. There is no guarding.  Musculoskeletal:     Right lower leg: No edema.     Left lower leg: No edema.  Neurological:     Mental Status: She is alert.           Assessment & Plan:  Essential hypertension - Plan: COMPLETE METABOLIC PANEL WITH GFR, Lipid panel, CBC with Differential/Platelet  Pure hypercholesterolemia - Plan: COMPLETE METABOLIC PANEL WITH GFR, Lipid panel, CBC with Differential/Platelet  Osteoporosis without current pathological fracture, unspecified osteoporosis type Her exam today is normal.  However her blood pressure is too high.  I have recommended resuming hydrochlorothiazide 25 mg a day trying to achieve a blood pressure less than 140/90.  I encouraged her to continue to take alendronate to reduce her risk of hip fractures.  At the present time she does not require any assistive devices to prevent falls.  Her balance is good.

## 2022-05-31 ENCOUNTER — Other Ambulatory Visit: Payer: Self-pay

## 2022-05-31 MED ORDER — ROSUVASTATIN CALCIUM 10 MG PO TABS: 10.0000 mg | ORAL_TABLET | Freq: Every day | ORAL | 3 refills | Status: DC

## 2022-06-01 LAB — TEST AUTHORIZATION

## 2022-06-01 LAB — COMPLETE METABOLIC PANEL WITH GFR
AG Ratio: 1.6 (calc) (ref 1.0–2.5)
ALT: 10 U/L (ref 6–29)
AST: 13 U/L (ref 10–35)
Albumin: 4.2 g/dL (ref 3.6–5.1)
Alkaline phosphatase (APISO): 60 U/L (ref 37–153)
BUN: 23 mg/dL (ref 7–25)
CO2: 23 mmol/L (ref 20–32)
Calcium: 9.4 mg/dL (ref 8.6–10.4)
Chloride: 109 mmol/L (ref 98–110)
Creat: 0.75 mg/dL (ref 0.60–0.95)
Globulin: 2.6 g/dL (calc) (ref 1.9–3.7)
Glucose, Bld: 145 mg/dL — ABNORMAL HIGH (ref 65–99)
Potassium: 3.7 mmol/L (ref 3.5–5.3)
Sodium: 142 mmol/L (ref 135–146)
Total Bilirubin: 0.3 mg/dL (ref 0.2–1.2)
Total Protein: 6.8 g/dL (ref 6.1–8.1)
eGFR: 80 mL/min/{1.73_m2} (ref >=60)

## 2022-06-01 LAB — LIPID PANEL
Cholesterol: 266 mg/dL — ABNORMAL HIGH (ref <200)
HDL: 67 mg/dL (ref >=50)
LDL Cholesterol (Calc): 171 mg/dL (calc) — ABNORMAL HIGH
Non-HDL Cholesterol (Calc): 199 mg/dL (calc) — ABNORMAL HIGH (ref <130)
Total CHOL/HDL Ratio: 4 (calc) (ref <5.0)
Triglycerides: 138 mg/dL (ref <150)

## 2022-06-01 LAB — CBC WITH DIFFERENTIAL/PLATELET
Absolute Monocytes: 632 cells/uL (ref 200–950)
Basophils Absolute: 47 cells/uL (ref 0–200)
Basophils Relative: 0.6 %
Eosinophils Absolute: 109 cells/uL (ref 15–500)
Eosinophils Relative: 1.4 %
HCT: 39 % (ref 35.0–45.0)
Hemoglobin: 13.3 g/dL (ref 11.7–15.5)
Lymphs Abs: 2512 cells/uL (ref 850–3900)
MCH: 32 pg (ref 27.0–33.0)
MCHC: 34.1 g/dL (ref 32.0–36.0)
MCV: 93.8 fL (ref 80.0–100.0)
MPV: 10.3 fL (ref 7.5–12.5)
Monocytes Relative: 8.1 %
Neutro Abs: 4501 cells/uL (ref 1500–7800)
Neutrophils Relative %: 57.7 %
Platelets: 266 10*3/uL (ref 140–400)
RBC: 4.16 10*6/uL (ref 3.80–5.10)
RDW: 12.3 % (ref 11.0–15.0)
Total Lymphocyte: 32.2 %
WBC: 7.8 10*3/uL (ref 3.8–10.8)

## 2022-06-01 LAB — HEMOGLOBIN A1C W/OUT EAG: Hgb A1c MFr Bld: 6.2 % of total Hgb — ABNORMAL HIGH (ref <5.7)

## 2022-08-17 ENCOUNTER — Other Ambulatory Visit: Payer: Self-pay | Admitting: Family Medicine

## 2022-08-17 DIAGNOSIS — I10 Essential (primary) hypertension: Secondary | ICD-10-CM

## 2022-08-17 NOTE — Telephone Encounter (Signed)
Requested Prescriptions  Pending Prescriptions Disp Refills   amLODipine (NORVASC) 10 MG tablet [Pharmacy Med Name: amLODIPine Besylate 10 MG Oral Tablet] 90 tablet 0    Sig: Take 1 tablet by mouth once daily     Cardiovascular: Calcium Channel Blockers 2 Failed - 08/17/2022  9:05 AM      Failed - Last BP in normal range    BP Readings from Last 1 Encounters:  05/30/22 (!) 142/80         Failed - Valid encounter within last 6 months    Recent Outpatient Visits           1 year ago Essential hypertension   [facility] Tanya Nones, Priscille Heidelberg, MD   1 year ago Encounter for Medicare annual wellness exam   [facility] Donita Brooks, MD   2 years ago Pure hypercholesterolemia   [facility] Medicine Tanya Nones, Priscille Heidelberg, MD   2 years ago Pure hypercholesterolemia   [facility] Medicine Tanya Nones, Priscille Heidelberg, MD   3 years ago Benign essential HTN   [facility] Medicine Pickard, Priscille Heidelberg, MD              Passed - Last Heart Rate in normal range    Pulse Readings from Last 1 Encounters:  05/30/22 73

## 2022-09-10 ENCOUNTER — Other Ambulatory Visit: Payer: Self-pay | Admitting: Family Medicine

## 2022-09-17 ENCOUNTER — Other Ambulatory Visit: Payer: Self-pay | Admitting: Family Medicine

## 2022-09-19 NOTE — Telephone Encounter (Signed)
Last OV 05/30/22, within protocol.  Requested Prescriptions  Pending Prescriptions Disp Refills   alendronate (FOSAMAX) 70 MG tablet [Pharmacy Med Name: Alendronate Sodium 70 MG Oral Tablet] 12 tablet 0    Sig: TAKE 1 TABLET ONCE A WEEK. TAKE WITH A FULL GLASS OF WATER ON AN EMPTY STOMACH. SIT UPRIGHT FOR 30 MINUTES AFTER TAKING.     Endocrinology:  Bisphosphonates Failed - 09/17/2022 11:17 AM      Failed - Vitamin D in normal range and within 360 days    Vit D, 25-Hydroxy  Date Value Ref Range Status  09/29/2016 57 30 - 100 ng/mL Final    Comment:    Vitamin D Status           25-OH Vitamin D        Deficiency                <20 ng/mL        Insufficiency         20 - 29 ng/mL        Optimal             > or = 30 ng/mL   For 25-OH Vitamin D testing on patients on D2-supplementation and patients for whom quantitation of D2 and D3 fractions is required, the QuestAssureD 25-OH VIT D, (D2,D3), LC/MS/MS is recommended: order code 40981 (patients > 2 yrs).          Failed - Mg Level in normal range and within 360 days    No results found for: "MG"       Failed - Phosphate in normal range and within 360 days    No results found for: "PHOS"       Failed - Valid encounter within last 12 months    Recent Outpatient Visits           1 year ago Essential hypertension   [facility] Pickard, Priscille Heidelberg, MD   2 years ago Encounter for Medicare annual wellness exam   [facility] Medicine Pickard, Priscille Heidelberg, MD   2 years ago Pure hypercholesterolemia   [facility] Family Medicine Tanya Nones, Priscille Heidelberg, MD   3 years ago Pure hypercholesterolemia   [facility] Donita Brooks, MD   3 years ago Benign essential HTN   [facility] Family Medicine Pickard, Priscille Heidelberg, MD              Passed - Ca in normal range and within 360 days    Calcium  Date Value Ref Range Status  05/30/2022 9.4 8.6 - 10.4 mg/dL Final         Passed - Cr in normal range and  within 360 days    Creat  Date Value Ref Range Status  05/30/2022 0.75 0.60 - 0.95 mg/dL Final         Passed - eGFR is 30 or above and within 360 days    GFR, Est African American  Date Value Ref Range Status  02/17/2020 83 > OR = 60 mL/min/1.64m2 Final   GFR, Est Non African American  Date Value Ref Range Status  02/17/2020 71 > OR = 60 mL/min/1.38m2 Final   eGFR  Date Value Ref Range Status  05/30/2022 80 > OR = 60 mL/min/1.51m2 Final         Passed - Bone Mineral Density or Dexa Scan completed in the last 2 years

## 2022-09-21 DIAGNOSIS — H401331 Pigmentary glaucoma, bilateral, mild stage: Secondary | ICD-10-CM | POA: Diagnosis not present

## 2022-09-21 DIAGNOSIS — H26493 Other secondary cataract, bilateral: Secondary | ICD-10-CM | POA: Diagnosis not present

## 2022-09-21 DIAGNOSIS — H04123 Dry eye syndrome of bilateral lacrimal glands: Secondary | ICD-10-CM | POA: Diagnosis not present

## 2022-09-21 DIAGNOSIS — H43813 Vitreous degeneration, bilateral: Secondary | ICD-10-CM | POA: Diagnosis not present

## 2022-10-05 DIAGNOSIS — Z8249 Family history of ischemic heart disease and other diseases of the circulatory system: Secondary | ICD-10-CM | POA: Diagnosis not present

## 2022-10-05 DIAGNOSIS — Z008 Encounter for other general examination: Secondary | ICD-10-CM | POA: Diagnosis not present

## 2022-10-05 DIAGNOSIS — Z809 Family history of malignant neoplasm, unspecified: Secondary | ICD-10-CM | POA: Diagnosis not present

## 2022-10-05 DIAGNOSIS — M810 Age-related osteoporosis without current pathological fracture: Secondary | ICD-10-CM | POA: Diagnosis not present

## 2022-10-05 DIAGNOSIS — E785 Hyperlipidemia, unspecified: Secondary | ICD-10-CM | POA: Diagnosis not present

## 2022-10-05 DIAGNOSIS — M1990 Unspecified osteoarthritis, unspecified site: Secondary | ICD-10-CM | POA: Diagnosis not present

## 2022-10-05 DIAGNOSIS — I10 Essential (primary) hypertension: Secondary | ICD-10-CM | POA: Diagnosis not present

## 2022-10-05 DIAGNOSIS — Z880 Allergy status to penicillin: Secondary | ICD-10-CM | POA: Diagnosis not present

## 2022-10-05 DIAGNOSIS — H409 Unspecified glaucoma: Secondary | ICD-10-CM | POA: Diagnosis not present

## 2022-10-05 DIAGNOSIS — Z7983 Long term (current) use of bisphosphonates: Secondary | ICD-10-CM | POA: Diagnosis not present

## 2022-10-05 DIAGNOSIS — Z823 Family history of stroke: Secondary | ICD-10-CM | POA: Diagnosis not present

## 2022-11-18 ENCOUNTER — Ambulatory Visit (INDEPENDENT_AMBULATORY_CARE_PROVIDER_SITE_OTHER): Payer: Medicare HMO | Admitting: Family Medicine

## 2022-11-18 VITALS — BP 150/70 | HR 56 | Temp 97.8°F | Wt 144.0 lb

## 2022-11-18 DIAGNOSIS — R7303 Prediabetes: Secondary | ICD-10-CM | POA: Diagnosis not present

## 2022-11-18 DIAGNOSIS — I10 Essential (primary) hypertension: Secondary | ICD-10-CM

## 2022-11-18 DIAGNOSIS — E7800 Pure hypercholesterolemia, unspecified: Secondary | ICD-10-CM | POA: Diagnosis not present

## 2022-11-18 MED ORDER — LOSARTAN POTASSIUM 50 MG PO TABS: 50.0000 mg | ORAL_TABLET | Freq: Every day | ORAL | 3 refills | Status: DC

## 2022-11-18 NOTE — Progress Notes (Signed)
Subjective:    Patient ID: Jillian Koch, female    DOB: 11/26/1940, 82 y.o.   MRN: [id number]  Patient has a history of hypertension for which she takes amlodipine and hydrochlorothiazide.  However, blood pressure remains elevated.  Denies chest pain, dyspnea, sob with activity, orthopnea, or irregular heart beats.  Would like to see her husband's cardiologist just to get established with him.  However, whe is not having any specific problem.  She is worried because her sister has CHF.  Past Medical History:  Diagnosis Date   Hypertension    Past Surgical History:  Procedure Laterality Date   ABDOMINAL HYSTERECTOMY     APPENDECTOMY     Current Outpatient Medications on File Prior to Visit  Medication Sig Dispense Refill   alendronate (FOSAMAX) 70 MG tablet TAKE 1 TABLET ONCE A WEEK. TAKE WITH A FULL GLASS OF WATER ON AN EMPTY STOMACH. SIT UPRIGHT FOR 30 MINUTES AFTER TAKING. 12 tablet 0   amLODipine (NORVASC) 10 MG tablet Take 1 tablet by mouth once daily 90 tablet 0   COD LIVER OIL PO Take by mouth. (Patient not taking: Reported on 05/30/2022)     doxycycline (VIBRA-TABS) 100 MG tablet Take 1 tablet (100 mg total) by mouth 2 (two) times daily. (Patient not taking: Reported on 05/30/2022) 14 tablet 0   fish oil-omega-3 fatty acids 1000 MG capsule Take 1,200 mg by mouth daily. (Patient not taking: Reported on 05/30/2022)     Ginkgo Biloba (GNP GINGKO BILOBA EXTRACT PO) Take by mouth. (Patient not taking: Reported on 05/30/2022)     hydrochlorothiazide (HYDRODIURIL) 25 MG tablet Take 1 tablet (25 mg total) by mouth daily. 90 tablet 3   latanoprost (XALATAN) 0.005 % ophthalmic solution 1 drop at bedtime. (Patient not taking: Reported on 05/30/2022)     Multiple Vitamins-Minerals (PRESERVISION AREDS PO) Take by mouth. (Patient not taking: Reported on 05/30/2022)     rosuvastatin (CRESTOR) 10 MG tablet Take 1 tablet (10 mg total) by mouth daily. 90 tablet 3   vitamin C (ASCORBIC ACID) 500 MG  tablet Take 500 mg by mouth daily. (Patient not taking: Reported on 05/30/2022)     zinc gluconate 50 MG tablet Take 50 mg by mouth daily. (Patient not taking: Reported on 05/30/2022)     No current facility-administered medications on file prior to visit.   Allergies  Allergen Reactions   Penicillins Rash   Social History   Socioeconomic History   Marital status: Married    Spouse name: Reita Cliche   Number of children: 1   Years of education: Not on file   Highest education level: Not on file  Occupational History   Not on file  Tobacco Use   Smoking status: Never   Smokeless tobacco: Never  Vaping Use   Vaping status: Never Used  Substance and Sexual Activity   Alcohol use: Never   Drug use: Never   Sexual activity: Yes  Other Topics Concern   Not on file  Social History Narrative   Married x 56 years 07/2021.   1 adopted son.   Social Determinants of Health   Financial Resource Strain: Low Risk  (07/16/2021)   Overall Financial Resource Strain (CARDIA)    Difficulty of Paying Living Expenses: Not hard at all  Food Insecurity: No Food Insecurity (07/16/2021)   Hunger Vital Sign    Worried About Running Out of Food in the Last Year: Never true    Ran Out of Food in the  Last Year: Never true  Transportation Needs: No Transportation Needs (07/16/2021)   PRAPARE - Administrator, Civil Service (Medical): No    Lack of Transportation (Non-Medical): No  Physical Activity: Sufficiently Active (07/16/2021)   Exercise Vital Sign    Days of Exercise per Week: 5 days    Minutes of Exercise per Session: 30 min  Stress: No Stress Concern Present (07/16/2021)   [employer] of Occupational Health - Occupational Stress Questionnaire    Feeling of Stress : Not at all  Social Connections: Socially Integrated (07/16/2021)   Social Connection and Isolation Panel [NHANES]    Frequency of Communication with Friends and Family: More than three times a week    Frequency of Social  Gatherings with Friends and Family: More than three times a week    Attends Religious Services: More than 4 times per year    Active Member of [employer] or Organizations: Yes    Attends Banker Meetings: More than 4 times per year    Marital Status: Married  Catering manager Violence: Not At Risk (07/16/2021)   Humiliation, Afraid, Rape, and Kick questionnaire    Fear of Current or Ex-Partner: No    Emotionally Abused: No    Physically Abused: No    Sexually Abused: No    Review of Systems  All other systems reviewed and are negative.      Objective:   Physical Exam Vitals reviewed.  Constitutional:      Appearance: Normal appearance.  Cardiovascular:     Rate and Rhythm: Normal rate and regular rhythm.     Heart sounds: Murmur heard.     No friction rub. No gallop.  Pulmonary:     Effort: Pulmonary effort is normal. No respiratory distress.     Breath sounds: Normal breath sounds. No wheezing, rhonchi or rales.  Abdominal:     General: Bowel sounds are normal. There is no distension.     Palpations: Abdomen is soft.     Tenderness: There is no abdominal tenderness. There is no guarding.  Musculoskeletal:     Right lower leg: No edema.     Left lower leg: No edema.  Neurological:     Mental Status: She is alert.           Assessment & Plan:  Essential hypertension - Plan: Hemoglobin A1c, COMPLETE METABOLIC PANEL WITH GFR, CBC with Differential/Platelet, Lipid panel  Pure hypercholesterolemia - Plan: Hemoglobin A1c, COMPLETE METABOLIC PANEL WITH GFR, CBC with Differential/Platelet, Lipid panel  Prediabetes - Plan: Hemoglobin A1c, COMPLETE METABOLIC PANEL WITH GFR, CBC with Differential/Platelet, Lipid panel Patient refuses a flu shot, covid shot and mammogram.  She would like to establish care with her husbands cardiologist but she is doing well and asymptomatic.  BP is too high.  Add losartan 50 mg poqday and recheck bp in 2 weeks.  Check cbc, cmp, flp, and  hga1c.

## 2022-11-19 LAB — CBC WITH DIFFERENTIAL/PLATELET
Absolute Monocytes: 562 {cells}/uL (ref 200–950)
Basophils Absolute: 43 {cells}/uL (ref 0–200)
Basophils Relative: 0.6 %
Eosinophils Absolute: 144 {cells}/uL (ref 15–500)
Eosinophils Relative: 2 %
HCT: 39.8 % (ref 35.0–45.0)
Hemoglobin: 13.2 g/dL (ref 11.7–15.5)
Lymphs Abs: 2671 {cells}/uL (ref 850–3900)
MCH: 31 pg (ref 27.0–33.0)
MCHC: 33.2 g/dL (ref 32.0–36.0)
MCV: 93.4 fL (ref 80.0–100.0)
MPV: 9.9 fL (ref 7.5–12.5)
Monocytes Relative: 7.8 %
Neutro Abs: 3780 {cells}/uL (ref 1500–7800)
Neutrophils Relative %: 52.5 %
Platelets: 238 10*3/uL (ref 140–400)
RBC: 4.26 10*6/uL (ref 3.80–5.10)
RDW: 12.3 % (ref 11.0–15.0)
Total Lymphocyte: 37.1 %
WBC: 7.2 10*3/uL (ref 3.8–10.8)

## 2022-11-19 LAB — LIPID PANEL
Cholesterol: 170 mg/dL (ref <200)
HDL: 67 mg/dL (ref >=50)
LDL Cholesterol (Calc): 86 mg/dL
Non-HDL Cholesterol (Calc): 103 mg/dL (ref <130)
Total CHOL/HDL Ratio: 2.5 (calc) (ref <5.0)
Triglycerides: 76 mg/dL (ref <150)

## 2022-11-19 LAB — COMPLETE METABOLIC PANEL WITH GFR
AG Ratio: 1.8 (calc) (ref 1.0–2.5)
ALT: 13 U/L (ref 6–29)
AST: 15 U/L (ref 10–35)
Albumin: 4.2 g/dL (ref 3.6–5.1)
Alkaline phosphatase (APISO): 56 U/L (ref 37–153)
BUN: 16 mg/dL (ref 7–25)
CO2: 29 mmol/L (ref 20–32)
Calcium: 9.5 mg/dL (ref 8.6–10.4)
Chloride: 103 mmol/L (ref 98–110)
Creat: 0.76 mg/dL (ref 0.60–0.95)
Globulin: 2.4 g/dL (ref 1.9–3.7)
Glucose, Bld: 97 mg/dL (ref 65–99)
Potassium: 3.8 mmol/L (ref 3.5–5.3)
Sodium: 140 mmol/L (ref 135–146)
Total Bilirubin: 0.4 mg/dL (ref 0.2–1.2)
Total Protein: 6.6 g/dL (ref 6.1–8.1)
eGFR: 78 mL/min/{1.73_m2} (ref >=60)

## 2022-11-19 LAB — HEMOGLOBIN A1C
Hgb A1c MFr Bld: 6.5 %{Hb} — ABNORMAL HIGH (ref <5.7)
Mean Plasma Glucose: 140 mg/dL
eAG (mmol/L): 7.7 mmol/L

## 2022-11-22 ENCOUNTER — Other Ambulatory Visit: Payer: Self-pay | Admitting: Family Medicine

## 2022-11-22 DIAGNOSIS — I10 Essential (primary) hypertension: Secondary | ICD-10-CM

## 2022-11-22 NOTE — Telephone Encounter (Signed)
Requested Prescriptions  Pending Prescriptions Disp Refills   amLODipine (NORVASC) 10 MG tablet [Pharmacy Med Name: amLODIPine Besylate 10 MG Oral Tablet] 90 tablet 0    Sig: Take 1 tablet by mouth once daily     Cardiovascular: Calcium Channel Blockers 2 Failed - 11/22/2022  8:09 AM      Failed - Last BP in normal range    BP Readings from Last 1 Encounters:  11/18/22 (!) 150/70         Failed - Valid encounter within last 6 months    Recent Outpatient Visits           1 year ago Essential hypertension   [facility] Medicine Tanya Nones, Priscille Heidelberg, MD   2 years ago Encounter for Medicare annual wellness exam   [facility] Medicine Donita Brooks, MD   2 years ago Pure hypercholesterolemia   [facility] Medicine Tanya Nones, Priscille Heidelberg, MD   3 years ago Pure hypercholesterolemia   [facility] Medicine Tanya Nones, Priscille Heidelberg, MD   3 years ago Benign essential HTN   [facility] Pickard, Priscille Heidelberg, MD       Future Appointments             In 5 months Pickard, Priscille Heidelberg, MD [facility]            Passed - Last Heart Rate in normal range    Pulse Readings from Last 1 Encounters:  11/18/22 (!) 56

## 2022-12-08 ENCOUNTER — Ambulatory Visit (INDEPENDENT_AMBULATORY_CARE_PROVIDER_SITE_OTHER): Payer: Medicare HMO

## 2022-12-08 VITALS — Ht 61.0 in | Wt 144.0 lb

## 2022-12-08 DIAGNOSIS — Z0000 Encounter for general adult medical examination without abnormal findings: Secondary | ICD-10-CM | POA: Diagnosis not present

## 2022-12-08 NOTE — Patient Instructions (Signed)
Jillian Koch , Thank you for taking time to come for your Medicare Wellness Visit. I appreciate your ongoing commitment to your health goals. Please review the following plan we discussed and let me know if I can assist you in the future.   Referrals/Orders/Follow-Ups/Clinician Recommendations: Aim for 30 minutes of exercise or brisk walking, 6-8 glasses of water, and 5 servings of fruits and vegetables each day.  This is a list of the screening recommended for you and due dates:  Health Maintenance  Topic Date Due   DTaP/Tdap/Td vaccine (1 - Tdap) Never done   Zoster (Shingles) Vaccine (1 of 2) Never done   Flu Shot  Never done   COVID-19 Vaccine (3 - 2023-24 season) 10/16/2022   Medicare Annual Wellness Visit  12/08/2023   Pneumonia Vaccine  Completed   DEXA scan (bone density measurement)  Completed   HPV Vaccine  Aged Out   Colon Cancer Screening  Discontinued   Hepatitis C Screening  Discontinued    Advanced directives: (ACP Link)Information on Advanced Care Planning can be found at [facility] Advance Health Care Directives Advance Health Care Directives ([email or web address]   Next Medicare Annual Wellness Visit scheduled for next year: Yes

## 2022-12-08 NOTE — Progress Notes (Signed)
Subjective:   Jillian Koch is a 82 y.o. female who presents for Medicare Annual (Subsequent) preventive examination.  Visit Complete: Virtual I connected with  Murrell Redden on 12/08/22 by a audio enabled telemedicine application and verified that I am speaking with the correct person using two identifiers.  Patient Location: Home  Provider Location: Home Office  I discussed the limitations of evaluation and management by telemedicine. The patient expressed understanding and agreed to proceed.  Vital Signs: Because this visit was a virtual/telehealth visit, some criteria may be missing or patient reported. Any vitals not documented were not able to be obtained and vitals that have been documented are patient reported.  Cardiac Risk Factors include: advanced age (>52men, >36 women);dyslipidemia;hypertension     Objective:    Today's Vitals   12/08/22 1337  Weight: 144 lb (65.3 kg)  Height: 5\' 1"  (1.549 m)   Body mass index is 27.21 kg/m.     12/08/2022    1:56 PM 07/16/2021   11:23 AM 07/10/2020   11:11 AM 02/05/2018    4:24 PM  Advanced Directives  Does Patient Have a Medical Advance Directive? No No No Yes  Type of Theme park manager;Living will  Does patient want to make changes to medical advance directive?    No - Patient declined  Copy of Healthcare Power of Attorney in Chart?    No - copy requested  Would patient like information on creating a medical advance directive? Yes (MAU/Ambulatory/Procedural Areas - Information given) No - Patient declined No - Patient declined No - Patient declined    Current Medications (verified) Outpatient Encounter Medications as of 12/08/2022  Medication Sig   alendronate (FOSAMAX) 70 MG tablet TAKE 1 TABLET ONCE A WEEK. TAKE WITH A FULL GLASS OF WATER ON AN EMPTY STOMACH. SIT UPRIGHT FOR 30 MINUTES AFTER TAKING.   amLODipine (NORVASC) 10 MG tablet Take 1 tablet by mouth once daily    hydrochlorothiazide (HYDRODIURIL) 25 MG tablet Take 1 tablet (25 mg total) by mouth daily.   losartan (COZAAR) 50 MG tablet Take 1 tablet (50 mg total) by mouth daily.   rosuvastatin (CRESTOR) 10 MG tablet Take 1 tablet (10 mg total) by mouth daily.   COD LIVER OIL PO Take by mouth. (Patient not taking: Reported on 05/30/2022)   fish oil-omega-3 fatty acids 1000 MG capsule Take 1,200 mg by mouth daily. (Patient not taking: Reported on 05/30/2022)   Ginkgo Biloba (GNP GINGKO BILOBA EXTRACT PO) Take by mouth. (Patient not taking: Reported on 05/30/2022)   latanoprost (XALATAN) 0.005 % ophthalmic solution 1 drop at bedtime. (Patient not taking: Reported on 05/30/2022)   Multiple Vitamins-Minerals (PRESERVISION AREDS PO) Take by mouth. (Patient not taking: Reported on 05/30/2022)   vitamin C (ASCORBIC ACID) 500 MG tablet Take 500 mg by mouth daily. (Patient not taking: Reported on 05/30/2022)   zinc gluconate 50 MG tablet Take 50 mg by mouth daily. (Patient not taking: Reported on 05/30/2022)   [DISCONTINUED] doxycycline (VIBRA-TABS) 100 MG tablet Take 1 tablet (100 mg total) by mouth 2 (two) times daily. (Patient not taking: Reported on 05/30/2022)   No facility-administered encounter medications on file as of 12/08/2022.    Allergies (verified) Penicillins   History: Past Medical History:  Diagnosis Date   Hypertension    Past Surgical History:  Procedure Laterality Date   ABDOMINAL HYSTERECTOMY     APPENDECTOMY     Family History  Problem Relation Age of  Onset   Colon cancer Maternal Aunt    Breast cancer Sister    Social History   Socioeconomic History   Marital status: Married    Spouse name: Reita Cliche   Number of children: 1   Years of education: Not on file   Highest education level: Not on file  Occupational History   Not on file  Tobacco Use   Smoking status: Never   Smokeless tobacco: Never  Vaping Use   Vaping status: Never Used  Substance and Sexual Activity   Alcohol  use: Never   Drug use: Never   Sexual activity: Yes  Other Topics Concern   Not on file  Social History Narrative   Married x 56 years 07/2021.   1 adopted son.   Social Determinants of Health   Financial Resource Strain: Low Risk  (12/08/2022)   Overall Financial Resource Strain (CARDIA)    Difficulty of Paying Living Expenses: Not hard at all  Food Insecurity: No Food Insecurity (12/08/2022)   Hunger Vital Sign    Worried About Running Out of Food in the Last Year: Never true    Ran Out of Food in the Last Year: Never true  Transportation Needs: No Transportation Needs (12/08/2022)   PRAPARE - Administrator, Civil Service (Medical): No    Lack of Transportation (Non-Medical): No  Physical Activity: Insufficiently Active (12/08/2022)   Exercise Vital Sign    Days of Exercise per Week: 4 days    Minutes of Exercise per Session: 30 min  Stress: No Stress Concern Present (12/08/2022)   [employer] of Occupational Health - Occupational Stress Questionnaire    Feeling of Stress : Not at all  Social Connections: Socially Integrated (12/08/2022)   Social Connection and Isolation Panel [NHANES]    Frequency of Communication with Friends and Family: More than three times a week    Frequency of Social Gatherings with Friends and Family: Three times a week    Attends Religious Services: More than 4 times per year    Active Member of Clubs or Organizations: Yes    Attends Engineer, structural: More than 4 times per year    Marital Status: Married    Tobacco Counseling Counseling given: Not Answered   Clinical Intake:  Pre-visit preparation completed: Yes  Pain : No/denies pain     Diabetes: No  How often do you need to have someone help you when you read instructions, pamphlets, or other written materials from your doctor or pharmacy?: 1 - Never  Interpreter Needed?: No  Information entered by :: Kandis Fantasia LPN   Activities of Daily  Living    12/08/2022    1:56 PM  In your present state of health, do you have any difficulty performing the following activities:  Hearing? 0  Vision? 0  Difficulty concentrating or making decisions? 0  Walking or climbing stairs? 0  Dressing or bathing? 0  Doing errands, shopping? 0  Preparing Food and eating ? N  Using the Toilet? N  In the past six months, have you accidently leaked urine? N  Do you have problems with loss of bowel control? N  Managing your Medications? N  Managing your Finances? N  Housekeeping or managing your Housekeeping? N    Patient Care Team: Donita Brooks, MD as PCP - General (Family Medicine) Janet Berlin, MD as Consulting Physician (Ophthalmology) Donzetta Starch, MD as Attending Physician (Dermatology)  Indicate any recent Medical Services you may have  received from other than [facility] providers in the past year (date may be approximate).     Assessment:   This is a routine wellness examination for Haillie.  Hearing/Vision screen Hearing Screening - Comments:: Denies hearing difficulties   Vision Screening - Comments:: Wears rx glasses - up to date with routine eye exams with Dr. Burgess Estelle     Goals Addressed   None   Depression Screen    12/08/2022    1:49 PM 07/16/2021   11:19 AM 07/10/2020   11:13 AM 02/18/2020    2:32 PM 05/03/2018    8:01 AM 11/02/2017    8:00 AM 04/13/2017    8:04 AM  PHQ 2/9 Scores  PHQ - 2 Score 0 0 0 0 0 0 0    Fall Risk    12/08/2022    1:55 PM 07/16/2021   11:23 AM 07/10/2020   11:12 AM 02/18/2020    2:32 PM 05/03/2018    8:01 AM  Fall Risk   Falls in the past year? 0 0 0 0 0  Number falls in past yr: 0 0 0 0   Injury with Fall? 0 0 0 0   Risk for fall due to : No Fall Risks No Fall Risks No Fall Risks    Follow up Falls prevention discussed;Education provided;Falls evaluation completed Falls prevention discussed Falls evaluation completed;Falls prevention discussed  Falls evaluation completed    MEDICARE  RISK AT HOME: Medicare Risk at Home Any stairs in or around the home?: No If so, are there any without handrails?: No Home free of loose throw rugs in walkways, pet beds, electrical cords, etc?: Yes Adequate lighting in your home to reduce risk of falls?: Yes Life alert?: No Use of a cane, walker or w/c?: No Grab bars in the bathroom?: Yes Shower chair or bench in shower?: No Elevated toilet seat or a handicapped toilet?: Yes  TIMED UP AND GO:  Was the test performed?  No    Cognitive Function:        12/08/2022    1:56 PM 07/16/2021   11:24 AM  6CIT Screen  What Year? 0 points 0 points  What month? 0 points 0 points  What time? 0 points 0 points  Count back from 20 0 points 0 points  Months in reverse 0 points 0 points  Repeat phrase 0 points 0 points  Total Score 0 points 0 points    Immunizations Immunization History  Administered Date(s) Administered   Moderna Sars-Covid-2 Vaccination 04/10/2019, 05/08/2019   Pneumococcal Conjugate-13 05/03/2018   Pneumococcal Polysaccharide-23 12/18/2006    TDAP status: Due, Education has been provided regarding the importance of this vaccine. Advised may receive this vaccine at local pharmacy or Health Dept. Aware to provide a copy of the vaccination record if obtained from local pharmacy or Health Dept. Verbalized acceptance and understanding.  Flu Vaccine status: Declined, Education has been provided regarding the importance of this vaccine but patient still declined. Advised may receive this vaccine at local pharmacy or Health Dept. Aware to provide a copy of the vaccination record if obtained from local pharmacy or Health Dept. Verbalized acceptance and understanding.  Pneumococcal vaccine status: Up to date  Covid-19 vaccine status: Information provided on how to obtain vaccines.   Qualifies for Shingles Vaccine? Yes   Zostavax completed No   Shingrix Completed?: No.    Education has been provided regarding the importance  of this vaccine. Patient has been advised to call insurance company to  determine out of pocket expense if they have not yet received this vaccine. Advised may also receive vaccine at local pharmacy or Health Dept. Verbalized acceptance and understanding.  Screening Tests Health Maintenance  Topic Date Due   DTaP/Tdap/Td (1 - Tdap) Never done   Zoster Vaccines- Shingrix (1 of 2) Never done   INFLUENZA VACCINE  Never done   COVID-19 Vaccine (3 - 2023-24 season) 10/16/2022   Medicare Annual Wellness (AWV)  12/08/2023   Pneumonia Vaccine 72+ Years old  Completed   DEXA SCAN  Completed   HPV VACCINES  Aged Out   Colonoscopy  Discontinued   Hepatitis C Screening  Discontinued    Health Maintenance  Health Maintenance Due  Topic Date Due   DTaP/Tdap/Td (1 - Tdap) Never done   Zoster Vaccines- Shingrix (1 of 2) Never done   INFLUENZA VACCINE  Never done   COVID-19 Vaccine (3 - 2023-24 season) 10/16/2022    Colorectal cancer screening: No longer required.   Mammogram status: No longer required due to age and preference .  Bone Density status: Completed 11/30/20. Results reflect: Bone density results: OSTEOPOROSIS. Repeat every 2 years.  Lung Cancer Screening: (Low Dose CT Chest recommended if Age 19-80 years, 20 pack-year currently smoking OR have quit w/in 15years.) does not qualify.   Lung Cancer Screening Referral: n/a  Additional Screening:  Hepatitis C Screening: does not qualify  Vision Screening: Recommended annual ophthalmology exams for early detection of glaucoma and other disorders of the eye. Is the patient up to date with their annual eye exam?  No  Who is the provider or what is the name of the office in which the patient attends annual eye exams? Dr. Burgess Estelle  If pt is not established with a provider, would they like to be referred to a provider to establish care? No .   Dental Screening: Recommended annual dental exams for proper oral hygiene  Community Resource  Referral / Chronic Care Management: CRR required this visit?  No   CCM required this visit?  No     Plan:     I have personally reviewed and noted the following in the patient's chart:   Medical and social history Use of alcohol, tobacco or illicit drugs  Current medications and supplements including opioid prescriptions. Patient is not currently taking opioid prescriptions. Functional ability and status Nutritional status Physical activity Advanced directives List of other physicians Hospitalizations, surgeries, and ER visits in previous 12 months Vitals Screenings to include cognitive, depression, and falls Referrals and appointments  In addition, I have reviewed and discussed with patient certain preventive protocols, quality metrics, and best practice recommendations. A written personalized care plan for preventive services as well as general preventive health recommendations were provided to patient.     Kandis Fantasia [place], [place]   27/25/3664   After Visit Summary: (Mail) Due to this being a telephonic visit, the after visit summary with patients personalized plan was offered to patient via mail   Nurse Notes: No concerns at this time

## 2022-12-17 ENCOUNTER — Other Ambulatory Visit: Payer: Self-pay | Admitting: Family Medicine

## 2022-12-19 NOTE — Telephone Encounter (Signed)
Requested medications are due for refill today.  yes  Requested medications are on the active medications list.  yes  Last refill. 09/19/2022 #12 0 rf  Future visit scheduled.   yes  Notes to clinic.  Missing labs.    Requested Prescriptions  Pending Prescriptions Disp Refills   alendronate (FOSAMAX) 70 MG tablet [Pharmacy Med Name: Alendronate Sodium 70 MG Oral Tablet] 12 tablet 0    Sig: TAKE 1 TABLET BY MOUTH ONCE A WEEK. TAKE WITH A FULL GLASS OF WATER ON AN EMPTY STOMACH. SIT UPRIGHT FOR 30 MINUTES AFTER TAKING.     Endocrinology:  Bisphosphonates Failed - 12/17/2022  8:09 AM      Failed - Vitamin D in normal range and within 360 days    Vit D, 25-Hydroxy  Date Value Ref Range Status  09/29/2016 57 30 - 100 ng/mL Final    Comment:    Vitamin D Status           25-OH Vitamin D        Deficiency                <20 ng/mL        Insufficiency         20 - 29 ng/mL        Optimal             > or = 30 ng/mL   For 25-OH Vitamin D testing on patients on D2-supplementation and patients for whom quantitation of D2 and D3 fractions is required, the QuestAssureD 25-OH VIT D, (D2,D3), LC/MS/MS is recommended: order code 40981 (patients > 2 yrs).          Failed - Mg Level in normal range and within 360 days    No results found for: "MG"       Failed - Phosphate in normal range and within 360 days    No results found for: "PHOS"       Failed - Valid encounter within last 12 months    Recent Outpatient Visits           1 year ago Essential hypertension   [facility] Pickard, Priscille Heidelberg, MD   2 years ago Encounter for Medicare annual wellness exam   [facility] Pickard, Priscille Heidelberg, MD   2 years ago Pure hypercholesterolemia   [facility] Tanya Nones, Priscille Heidelberg, MD   3 years ago Pure hypercholesterolemia   [facility] Tanya Nones, Priscille Heidelberg, MD   3 years ago Benign essential HTN   [facility] Donita Brooks, MD       Future Appointments             In 1 month Clifton James, Nile Dear, MD [facility]   In 5 months Pickard, Priscille Heidelberg, MD [facility]            Failed - Bone Mineral Density or Dexa Scan completed in the last 2 years      Passed - Ca in normal range and within 360 days    Calcium  Date Value Ref Range Status  11/18/2022 9.5 8.6 - 10.4 mg/dL Final         Passed - Cr in normal range and within 360 days    Creat  Date Value Ref Range Status  11/18/2022 0.76 0.60 - 0.95 mg/dL Final  Passed - eGFR is 30 or above and within 360 days    GFR, Est African American  Date Value Ref Range Status  02/17/2020 83 > OR = 60 mL/min/1.61m2 Final   GFR, Est Non African American  Date Value Ref Range Status  02/17/2020 71 > OR = 60 mL/min/1.14m2 Final   eGFR  Date Value Ref Range Status  11/18/2022 78 > OR = 60 mL/min/1.93m2 Final

## 2022-12-30 ENCOUNTER — Telehealth: Payer: Self-pay

## 2022-12-30 DIAGNOSIS — M810 Age-related osteoporosis without current pathological fracture: Secondary | ICD-10-CM

## 2022-12-30 MED ORDER — ALENDRONATE SODIUM 70 MG PO TABS: ORAL_TABLET | ORAL | 0 refills | Status: DC

## 2022-12-30 NOTE — Telephone Encounter (Signed)
Refill sent. Mjp,lpn  Copied from CRM [id number]. Topic: Clinical - Medication Refill >> Dec 30, 2022  2:28 PM Almira Coaster wrote: Most Recent Primary Care Visit:  Provider: Anthoney Harada  Department: [facility]  Visit Type: MEDICARE AWV, SEQUENTIAL  Date: 12/08/2022  Medication: alendronate (FOSAMAX) 70 MG tablet  Has the patient contacted their pharmacy? Yes (Agent: If no, request that the patient contact the pharmacy for the refill. If patient does not wish to contact the pharmacy document the reason why and proceed with request.) (Agent: If yes, when and what did the pharmacy advise?)  Is this the correct pharmacy for this prescription? Yes If no, delete pharmacy and type the correct one.  This is the patient's preferred pharmacy:  [facility] [address] [place] - [address] [place] [address] [place] [address] Phone: [phone] Fax: [phone]   Has the prescription been filled recently? No  Is the patient out of the medication? Yes  Has the patient been seen for an appointment in the last year OR does the patient have an upcoming appointment? Yes  Can we respond through MyChart? No  Agent: Please be advised that Rx refills may take up to 3 business days. We ask that you follow-up with your pharmacy.

## 2023-01-24 ENCOUNTER — Other Ambulatory Visit: Payer: Self-pay | Admitting: Family Medicine

## 2023-01-24 DIAGNOSIS — I10 Essential (primary) hypertension: Secondary | ICD-10-CM

## 2023-02-02 NOTE — Progress Notes (Signed)
Chief Complaint  Patient presents with   New Patient (Initial Visit)    Establish cardiology care   History of Present Illness: 82 yo Koch with history of HTN who is here today as a new patient to establish cardiology care. She has no chest pain, dyspnea, dizziness, palpitations, near syncope, syncope or LE edema. No prior cardiac history.   I also follow her husband Marly Camblin.   Primary Care Physician: Donita Brooks, MD   Past Medical History:  Diagnosis Date   Hypertension     Past Surgical History:  Procedure Laterality Date   ABDOMINAL HYSTERECTOMY     APPENDECTOMY      Current Outpatient Medications  Medication Sig Dispense Refill   alendronate (FOSAMAX) 70 MG tablet TAKE 1 TABLET ONCE A WEEK. TAKE WITH A FULL GLASS OF WATER ON AN EMPTY STOMACH. SIT UPRIGHT FOR 30 MINUTES AFTER TAKING. 12 tablet 0   amLODipine (NORVASC) 10 MG tablet Take 1 tablet by mouth once daily 90 tablet 0   COD LIVER OIL PO Take by mouth.     Ginkgo Biloba (GNP GINGKO BILOBA EXTRACT PO) Take by mouth.     hydrochlorothiazide (HYDRODIURIL) 25 MG tablet Take 1 tablet (25 mg total) by mouth daily. 90 tablet 3   latanoprost (XALATAN) 0.005 % ophthalmic solution 1 drop at bedtime.     losartan (COZAAR) 50 MG tablet Take 1 tablet (50 mg total) by mouth daily. 90 tablet 3   Multiple Vitamins-Minerals (PRESERVISION AREDS PO) Take by mouth.     rosuvastatin (CRESTOR) 10 MG tablet Take 1 tablet (10 mg total) by mouth daily. 90 tablet 3   vitamin C (ASCORBIC ACID) 500 MG tablet Take 500 mg by mouth daily.     zinc gluconate 50 MG tablet Take 50 mg by mouth daily.     fish oil-omega-3 fatty acids 1000 MG capsule Take 1,200 mg by mouth daily. (Patient not taking: Reported on 02/03/2023)     No current facility-administered medications for this visit.    Allergies  Allergen Reactions   Penicillins Rash    Social History   Socioeconomic History   Marital status: Married    Spouse name:  Reita Cliche   Number of children: 1   Years of education: Not on file   Highest education level: Not on file  Occupational History   Occupation: Retired-Cleans houses  Tobacco Use   Smoking status: Never   Smokeless tobacco: Never  Vaping Use   Vaping status: Never Used  Substance and Sexual Activity   Alcohol use: Never   Drug use: Never   Sexual activity: Yes  Other Topics Concern   Not on file  Social History Narrative   Married x 56 years 07/2021.   1 adopted son.   Social Drivers of Corporate investment banker Strain: Low Risk  (12/08/2022)   Overall Financial Resource Strain (CARDIA)    Difficulty of Paying Living Expenses: Not hard at all  Food Insecurity: No Food Insecurity (12/08/2022)   Hunger Vital Sign    Worried About Running Out of Food in the Last Year: Never true    Ran Out of Food in the Last Year: Never true  Transportation Needs: No Transportation Needs (12/08/2022)   PRAPARE - Administrator, Civil Service (Medical): No    Lack of Transportation (Non-Medical): No  Physical Activity: Insufficiently Active (12/08/2022)   Exercise Vital Sign    Days of Exercise per Week: 4 days  Minutes of Exercise per Session: 30 min  Stress: No Stress Concern Present (12/08/2022)   [employer] of Occupational Health - Occupational Stress Questionnaire    Feeling of Stress : Not at all  Social Connections: Socially Integrated (12/08/2022)   Social Connection and Isolation Panel [NHANES]    Frequency of Communication with Friends and Family: More than three times a week    Frequency of Social Gatherings with Friends and Family: Three times a week    Attends Religious Services: More than 4 times per year    Active Member of Clubs or Organizations: Yes    Attends Banker Meetings: More than 4 times per year    Marital Status: Married  Catering manager Violence: Not At Risk (12/08/2022)   Humiliation, Afraid, Rape, and Kick questionnaire     Fear of Current or Ex-Partner: No    Emotionally Abused: No    Physically Abused: No    Sexually Abused: No    Family History  Problem Relation Age of Onset   Stroke Mother    Breast cancer Sister    Colon cancer Maternal Aunt     Review of Systems:  As stated in the HPI and otherwise negative.   BP (!) 146/68 (BP Location: Left Arm, Patient Position: Sitting)   Pulse 65   Ht 5\' 1"  (1.549 m)   Wt 65 kg   SpO2 98%   BMI 27.06 kg/m   Physical Examination: General: Well developed, well nourished, NAD  HEENT: OP clear, mucus membranes moist  SKIN: warm, dry. No rashes. Neuro: No focal deficits  Musculoskeletal: Muscle strength 5/5 all ext  Psychiatric: Mood and affect normal  Neck: No JVD, no carotid bruits, no thyromegaly, no lymphadenopathy.  Lungs:Clear bilaterally, no wheezes, rhonci, crackles Cardiovascular: Regular rate and rhythm. Soft systolic murmur.  Abdomen:Soft. Bowel sounds present. Non-tender.  Extremities: No lower extremity edema. Pulses are 2 + in the bilateral DP/PT.  EKG:  EKG is ordered today. The ekg ordered today demonstrates  EKG Interpretation Date/Time:  Friday February 03 2023 08:47:33 EST Ventricular Rate:  Jillian PR Interval:  142 QRS Duration:  86 QT Interval:  414 QTC Calculation: 416 R Axis:   5  Text Interpretation: Normal sinus rhythm Poor R wave progression Confirmed by Verne Carrow [phone] on 02/03/2023 8:51:46 AM    Recent Labs: 11/18/2022: ALT 13; BUN 16; Creat 0.76; Hemoglobin 13.2; Platelets 238; Potassium 3.8; Sodium 140   Lipid Panel    Component Value Date/Time   CHOL 170 11/18/2022 1307   TRIG 76 11/18/2022 1307   HDL 67 11/18/2022 1307   CHOLHDL 2.5 11/18/2022 1307   VLDL 14 09/29/2016 1008   LDLCALC 86 11/18/2022 1307     Wt Readings from Last 3 Encounters:  02/03/23 65 kg  12/08/22 65.3 kg  11/18/22 65.3 kg    Assessment and Plan:   1. HTN: BP is slightly elevated today. She recently was started on  Losartan. She will follow her BP at home and let Dr. Tanya Nones know if it is running above goal.   2. Cardiac murmur: Will arrange an echo to assess.   Labs/ tests ordered today include:   Orders Placed This Encounter  Procedures   EKG 12-Lead   ECHOCARDIOGRAM COMPLETE   Disposition:   F/U with me in one year   Signed, Verne Carrow, MD, [facility] 02/03/2023 9:34 AM    [facility] [address] [place], [place], [place]  [address] Phone: [phone])  [phone]; Fax: [phone]

## 2023-02-03 ENCOUNTER — Encounter: Payer: Self-pay | Admitting: Cardiovascular Disease

## 2023-02-03 ENCOUNTER — Ambulatory Visit: Payer: Medicare HMO | Attending: Cardiovascular Disease | Admitting: Cardiovascular Disease

## 2023-02-03 VITALS — BP 146/68 | HR 65 | Ht 61.0 in | Wt 143.2 lb

## 2023-02-03 DIAGNOSIS — I10 Essential (primary) hypertension: Secondary | ICD-10-CM

## 2023-02-03 DIAGNOSIS — R011 Cardiac murmur, unspecified: Secondary | ICD-10-CM | POA: Diagnosis not present

## 2023-02-03 NOTE — Patient Instructions (Signed)
Medication Instructions:  No changes *If you need a refill on your cardiac medications before your next appointment, please call your pharmacy*   Lab Work: none If you have labs (blood work) drawn today and your tests are completely normal, you will receive your results only by: MyChart Message (if you have MyChart) OR A paper copy in the mail If you have any lab test that is abnormal or we need to change your treatment, we will call you to review the results.   Testing/Procedures: Your physician has requested that you have an echocardiogram. Echocardiography is a painless test that uses sound waves to create images of your heart. It provides your doctor with information about the size and shape of your heart and how well your heart's chambers and valves are working. This procedure takes approximately one hour. There are no restrictions for this procedure. Please do NOT wear cologne, perfume, aftershave, or lotions (deodorant is allowed). Please arrive 15 minutes prior to your appointment time.  Please note: We ask at that you not bring children with you during ultrasound (echo/ vascular) testing. Due to room size and safety concerns, children are not allowed in the ultrasound rooms during exams. Our front office staff cannot provide observation of children in our lobby area while testing is being conducted. An adult accompanying a patient to their appointment will only be allowed in the ultrasound room at the discretion of the ultrasound technician under special circumstances. We apologize for any inconvenience.    Follow-Up: At [facility], you and your health needs are our priority.  As part of our continuing mission to provide you with exceptional heart care, we have created designated Provider Care Teams.  These Care Teams include your primary Cardiologist (physician) and Advanced Practice Providers (APPs -  Physician Assistants and Nurse Practitioners) who all work together to  provide you with the care you need, when you need it.  We recommend signing up for the patient portal called "MyChart".  Sign up information is provided on this After Visit Summary.  MyChart is used to connect with patients for Virtual Visits (Telemedicine).  Patients are able to view lab/test results, encounter notes, upcoming appointments, etc.  Non-urgent messages can be sent to your provider as well.   To learn more about what you can do with MyChart, go to [email or web address].    Your next appointment:   12 month(s)  Provider:   Verne Carrow, MD

## 2023-03-06 ENCOUNTER — Ambulatory Visit (HOSPITAL_COMMUNITY): Payer: Medicare HMO | Attending: Cardiovascular Disease

## 2023-03-06 DIAGNOSIS — R011 Cardiac murmur, unspecified: Secondary | ICD-10-CM | POA: Diagnosis not present

## 2023-03-06 LAB — ECHOCARDIOGRAM COMPLETE
Area-P 1/2: 4.26 cm2
S' Lateral: 2.1 cm

## 2023-03-11 ENCOUNTER — Other Ambulatory Visit: Payer: Self-pay | Admitting: Family Medicine

## 2023-03-11 DIAGNOSIS — M810 Age-related osteoporosis without current pathological fracture: Secondary | ICD-10-CM

## 2023-03-13 DIAGNOSIS — L820 Inflamed seborrheic keratosis: Secondary | ICD-10-CM | POA: Diagnosis not present

## 2023-03-13 DIAGNOSIS — Z85828 Personal history of other malignant neoplasm of skin: Secondary | ICD-10-CM | POA: Diagnosis not present

## 2023-04-07 DIAGNOSIS — H524 Presbyopia: Secondary | ICD-10-CM | POA: Diagnosis not present

## 2023-04-07 DIAGNOSIS — H401331 Pigmentary glaucoma, bilateral, mild stage: Secondary | ICD-10-CM | POA: Diagnosis not present

## 2023-04-07 DIAGNOSIS — H52203 Unspecified astigmatism, bilateral: Secondary | ICD-10-CM | POA: Diagnosis not present

## 2023-04-07 DIAGNOSIS — H04123 Dry eye syndrome of bilateral lacrimal glands: Secondary | ICD-10-CM | POA: Diagnosis not present

## 2023-04-12 DIAGNOSIS — I10 Essential (primary) hypertension: Secondary | ICD-10-CM | POA: Diagnosis not present

## 2023-04-12 DIAGNOSIS — I509 Heart failure, unspecified: Secondary | ICD-10-CM | POA: Diagnosis not present

## 2023-04-12 DIAGNOSIS — E663 Overweight: Secondary | ICD-10-CM | POA: Diagnosis not present

## 2023-04-12 DIAGNOSIS — E785 Hyperlipidemia, unspecified: Secondary | ICD-10-CM | POA: Diagnosis not present

## 2023-04-12 DIAGNOSIS — I110 Hypertensive heart disease with heart failure: Secondary | ICD-10-CM | POA: Diagnosis not present

## 2023-04-27 DIAGNOSIS — D485 Neoplasm of uncertain behavior of skin: Secondary | ICD-10-CM | POA: Diagnosis not present

## 2023-04-27 DIAGNOSIS — C44629 Squamous cell carcinoma of skin of left upper limb, including shoulder: Secondary | ICD-10-CM | POA: Diagnosis not present

## 2023-04-27 DIAGNOSIS — Z85828 Personal history of other malignant neoplasm of skin: Secondary | ICD-10-CM | POA: Diagnosis not present

## 2023-05-19 ENCOUNTER — Ambulatory Visit: Payer: Medicare HMO | Admitting: Family Medicine

## 2023-05-26 ENCOUNTER — Encounter: Payer: Self-pay | Admitting: Family Medicine

## 2023-05-26 ENCOUNTER — Ambulatory Visit: Admitting: Family Medicine

## 2023-05-26 VITALS — BP 112/60 | HR 65 | Temp 97.8°F | Ht 60.0 in | Wt 143.2 lb

## 2023-05-26 DIAGNOSIS — I10 Essential (primary) hypertension: Secondary | ICD-10-CM | POA: Diagnosis not present

## 2023-05-26 DIAGNOSIS — R7303 Prediabetes: Secondary | ICD-10-CM

## 2023-05-26 DIAGNOSIS — Z23 Encounter for immunization: Secondary | ICD-10-CM

## 2023-05-26 DIAGNOSIS — E7800 Pure hypercholesterolemia, unspecified: Secondary | ICD-10-CM | POA: Diagnosis not present

## 2023-05-26 NOTE — Addendum Note (Signed)
 Addended by: Renee Pain on: 05/26/2023 10:26 AM   Modules accepted: Orders

## 2023-05-26 NOTE — Progress Notes (Signed)
 Subjective:    Patient ID: Jillian Koch, female    DOB: Mar 10, 1940, 83 y.o.   MRN: [id number] Patient is a very pleasant 83 yo wf with history of htn, hld and prediabetes.  Doing very well.  Denies cp, sob, doe.  Denies abdominal pain, n,v,d.  Eating well. Denies falls or memory loss.  Due to recheck sugars.  Denies polyuria, polydypsia, or blurry vision.   Past Medical History:  Diagnosis Date   Hypertension    Past Surgical History:  Procedure Laterality Date   ABDOMINAL HYSTERECTOMY     APPENDECTOMY     Current Outpatient Medications on File Prior to Visit  Medication Sig Dispense Refill   alendronate (FOSAMAX) 70 MG tablet TAKE 1 TABLET BY MOUTH ONCE A WEEK. TAKE WITH A FULL GLASS OF WATER ON AN EMPTY STOMACH. SIT UPRIGHT FOR 30 MNUTES AFTER TAKING. 12 tablet 0   amLODipine (NORVASC) 10 MG tablet Take 1 tablet by mouth once daily 90 tablet 0   COD LIVER OIL PO Take by mouth.     fish oil-omega-3 fatty acids 1000 MG capsule Take 1,200 mg by mouth daily. (Patient not taking: Reported on 02/03/2023)     Ginkgo Biloba (GNP GINGKO BILOBA EXTRACT PO) Take by mouth.     hydrochlorothiazide (HYDRODIURIL) 25 MG tablet Take 1 tablet (25 mg total) by mouth daily. 90 tablet 3   latanoprost (XALATAN) 0.005 % ophthalmic solution 1 drop at bedtime.     losartan (COZAAR) 50 MG tablet Take 1 tablet (50 mg total) by mouth daily. 90 tablet 3   Multiple Vitamins-Minerals (PRESERVISION AREDS PO) Take by mouth.     rosuvastatin (CRESTOR) 10 MG tablet Take 1 tablet (10 mg total) by mouth daily. 90 tablet 3   vitamin C (ASCORBIC ACID) 500 MG tablet Take 500 mg by mouth daily.     zinc gluconate 50 MG tablet Take 50 mg by mouth daily.     No current facility-administered medications on file prior to visit.   Allergies  Allergen Reactions   Penicillins Rash   Social History   Socioeconomic History   Marital status: Married    Spouse name: Reita Cliche   Number of children: 1   Years of education:  Not on file   Highest education level: Not on file  Occupational History   Occupation: Retired-Cleans houses  Tobacco Use   Smoking status: Never   Smokeless tobacco: Never  Vaping Use   Vaping status: Never Used  Substance and Sexual Activity   Alcohol use: Never   Drug use: Never   Sexual activity: Yes  Other Topics Concern   Not on file  Social History Narrative   Married x 56 years 07/2021.   1 adopted son.   Social Drivers of Corporate investment banker Strain: Low Risk  (12/08/2022)   Overall Financial Resource Strain (CARDIA)    Difficulty of Paying Living Expenses: Not hard at all  Food Insecurity: No Food Insecurity (12/08/2022)   Hunger Vital Sign    Worried About Running Out of Food in the Last Year: Never true    Ran Out of Food in the Last Year: Never true  Transportation Needs: No Transportation Needs (12/08/2022)   PRAPARE - Administrator, Civil Service (Medical): No    Lack of Transportation (Non-Medical): No  Physical Activity: Insufficiently Active (12/08/2022)   Exercise Vital Sign    Days of Exercise per Week: 4 days    Minutes of  Exercise per Session: 30 min  Stress: No Stress Concern Present (12/08/2022)   [employer] of Occupational Health - Occupational Stress Questionnaire    Feeling of Stress : Not at all  Social Connections: Socially Integrated (12/08/2022)   Social Connection and Isolation Panel [NHANES]    Frequency of Communication with Friends and Family: More than three times a week    Frequency of Social Gatherings with Friends and Family: Three times a week    Attends Religious Services: More than 4 times per year    Active Member of Clubs or Organizations: Yes    Attends Banker Meetings: More than 4 times per year    Marital Status: Married  Catering manager Violence: Not At Risk (12/08/2022)   Humiliation, Afraid, Rape, and Kick questionnaire    Fear of Current or Ex-Partner: No    Emotionally Abused:  No    Physically Abused: No    Sexually Abused: No    Review of Systems  All other systems reviewed and are negative.      Objective:   Physical Exam Vitals reviewed.  Constitutional:      Appearance: Normal appearance.  Cardiovascular:     Rate and Rhythm: Normal rate and regular rhythm.     Heart sounds: Murmur heard.     No friction rub. No gallop.  Pulmonary:     Effort: Pulmonary effort is normal. No respiratory distress.     Breath sounds: Normal breath sounds. No wheezing, rhonchi or rales.  Abdominal:     General: Bowel sounds are normal. There is no distension.     Palpations: Abdomen is soft.     Tenderness: There is no abdominal tenderness. There is no guarding.  Musculoskeletal:     Right lower leg: No edema.     Left lower leg: No edema.  Neurological:     Mental Status: She is alert.       Assessment & Plan:  Essential hypertension - Plan: CBC with Differential/Platelet, COMPLETE METABOLIC PANEL WITHOUT GFR, Lipid panel, Hemoglobin A1c  Pure hypercholesterolemia - Plan: CBC with Differential/Platelet, COMPLETE METABOLIC PANEL WITHOUT GFR, Lipid panel, Hemoglobin A1c  Prediabetes - Plan: CBC with Differential/Platelet, COMPLETE METABOLIC PANEL WITHOUT GFR, Lipid panel, Hemoglobin A1c Requests shingrix.  Check cbc, cmp, flp, hga1c.  Bp is outstanding.  No sign of memory loss or depression or elevated fall risk.  Goal ldl is less than 100 and goal hga1c is less than 6.5.

## 2023-05-27 LAB — LIPID PANEL
Cholesterol: 179 mg/dL (ref <200)
HDL: 69 mg/dL (ref >=50)
LDL Cholesterol (Calc): 96 mg/dL
Non-HDL Cholesterol (Calc): 110 mg/dL (ref <130)
Total CHOL/HDL Ratio: 2.6 (calc) (ref <5.0)
Triglycerides: 47 mg/dL (ref <150)

## 2023-05-27 LAB — COMPLETE METABOLIC PANEL WITHOUT GFR
AG Ratio: 1.6 (calc) (ref 1.0–2.5)
ALT: 15 U/L (ref 6–29)
AST: 16 U/L (ref 10–35)
Albumin: 4.2 g/dL (ref 3.6–5.1)
Alkaline phosphatase (APISO): 52 U/L (ref 37–153)
BUN: 20 mg/dL (ref 7–25)
CO2: 27 mmol/L (ref 20–32)
Calcium: 9.6 mg/dL (ref 8.6–10.4)
Chloride: 103 mmol/L (ref 98–110)
Creat: 0.81 mg/dL (ref 0.60–0.95)
Globulin: 2.6 g/dL (ref 1.9–3.7)
Glucose, Bld: 88 mg/dL (ref 65–99)
Potassium: 4 mmol/L (ref 3.5–5.3)
Sodium: 138 mmol/L (ref 135–146)
Total Bilirubin: 0.5 mg/dL (ref 0.2–1.2)
Total Protein: 6.8 g/dL (ref 6.1–8.1)

## 2023-05-27 LAB — HEMOGLOBIN A1C
Hgb A1c MFr Bld: 6.5 %{Hb} — ABNORMAL HIGH (ref <5.7)
Mean Plasma Glucose: 140 mg/dL
eAG (mmol/L): 7.7 mmol/L

## 2023-05-27 LAB — CBC WITH DIFFERENTIAL/PLATELET
Absolute Lymphocytes: 2615 {cells}/uL (ref 850–3900)
Absolute Monocytes: 580 {cells}/uL (ref 200–950)
Basophils Absolute: 62 {cells}/uL (ref 0–200)
Basophils Relative: 0.9 %
Eosinophils Absolute: 104 {cells}/uL (ref 15–500)
Eosinophils Relative: 1.5 %
HCT: 39.4 % (ref 35.0–45.0)
Hemoglobin: 13.1 g/dL (ref 11.7–15.5)
MCH: 31.4 pg (ref 27.0–33.0)
MCHC: 33.2 g/dL (ref 32.0–36.0)
MCV: 94.5 fL (ref 80.0–100.0)
MPV: 9.8 fL (ref 7.5–12.5)
Monocytes Relative: 8.4 %
Neutro Abs: 3540 {cells}/uL (ref 1500–7800)
Neutrophils Relative %: 51.3 %
Platelets: 245 10*3/uL (ref 140–400)
RBC: 4.17 10*6/uL (ref 3.80–5.10)
RDW: 12.3 % (ref 11.0–15.0)
Total Lymphocyte: 37.9 %
WBC: 6.9 10*3/uL (ref 3.8–10.8)

## 2023-06-10 ENCOUNTER — Other Ambulatory Visit: Payer: Self-pay | Admitting: Family Medicine

## 2023-06-10 DIAGNOSIS — M810 Age-related osteoporosis without current pathological fracture: Secondary | ICD-10-CM

## 2023-06-12 NOTE — Telephone Encounter (Signed)
 Requested medication (s) are due for refill today: yes  Requested medication (s) are on the active medication list: yes  Last refill:  03/13/23 #12 tab  Future visit scheduled: no  Notes to clinic:  overdue lab work   Requested Prescriptions  Pending Prescriptions Disp Refills   alendronate  (FOSAMAX ) 70 MG tablet [Pharmacy Med Name: Alendronate  Sodium 70 MG Oral Tablet] 12 tablet 0    Sig: TAKE 1 TABLET BY MOUTH ONCE A WEEK. TAKE WITH A FULL GLASS OF WATER ON AN EMPTY STOMACH. SIT UPRIGHT FOR 30 MINUTES AFTER TAKING.     Endocrinology:  Bisphosphonates Failed - 06/12/2023 12:20 PM      Failed - Vitamin D  in normal range and within 360 days    Vit D, 25-Hydroxy  Date Value Ref Range Status  09/29/2016 57 30 - 100 ng/mL Final    Comment:    Vitamin D  Status           25-OH Vitamin D         Deficiency                <20 ng/mL        Insufficiency         20 - 29 ng/mL        Optimal             > or = 30 ng/mL   For 25-OH Vitamin D  testing on patients on D2-supplementation and patients for whom quantitation of D2 and D3 fractions is required, the [facility] 25-OH VIT D, (D2,D3), LC/MS/MS is recommended: order code 08657 (patients > 2 yrs).          Failed - Mg Level in normal range and within 360 days    No results found for: "MG"       Failed - Phosphate in normal range and within 360 days    No results found for: "PHOS"       Failed - Bone Mineral Density or Dexa Scan completed in the last 2 years      Passed - Ca in normal range and within 360 days    Calcium   Date Value Ref Range Status  05/26/2023 9.6 8.6 - 10.4 mg/dL Final         Passed - Cr in normal range and within 360 days    Creat  Date Value Ref Range Status  05/26/2023 0.81 0.60 - 0.95 mg/dL Final         Passed - eGFR is 30 or above and within 360 days    GFR, Est African American  Date Value Ref Range Status  02/17/2020 83 > OR = 60 mL/min/1.47m2 Final   GFR, Est Non African American  Date  Value Ref Range Status  02/17/2020 71 > OR = 60 mL/min/1.41m2 Final   eGFR  Date Value Ref Range Status  11/18/2022 78 > OR = 60 mL/min/1.79m2 Final         Passed - Valid encounter within last 12 months    Recent Outpatient Visits           2 weeks ago Essential hypertension   [place] [facility] Pickard, Cisco Crest, MD   6 months ago Essential hypertension   [place] [facility] Pickard, Cisco Crest, MD   1 year ago Essential hypertension   [place] [facility] Austine Lefort, MD   1 year ago Essential hypertension   [place] [facility] Pickard, Cisco Crest, MD

## 2023-06-30 ENCOUNTER — Other Ambulatory Visit: Payer: Self-pay | Admitting: Family Medicine

## 2023-07-03 NOTE — Telephone Encounter (Signed)
 Requested Prescriptions  Pending Prescriptions Disp Refills   rosuvastatin  (CRESTOR ) 10 MG tablet [Pharmacy Med Name: Rosuvastatin  Calcium  10 MG Oral Tablet] 90 tablet 0    Sig: Take 1 tablet by mouth once daily     Cardiovascular:  Antilipid - Statins 2 Failed - 07/03/2023  4:15 PM      Failed - Lipid Panel in normal range within the last 12 months    Cholesterol  Date Value Ref Range Status  05/26/2023 179 <200 mg/dL Final   LDL Cholesterol (Calc)  Date Value Ref Range Status  05/26/2023 96 mg/dL (calc) Final    Comment:    Reference range: <100 . Desirable range <100 mg/dL for primary prevention;   <70 mg/dL for patients with CHD or diabetic patients  with > or = 2 CHD risk factors. Aaron Aas LDL-C is now calculated using the Macera-Hopkins  calculation, which is a validated novel method providing  better accuracy than the Friedewald equation in the  estimation of LDL-C.  Melinda Sprawls et al. Erroll Heard. 1610;960(45): 2061-2068  (http://education.[facility].com/faq/FAQ164)    HDL  Date Value Ref Range Status  05/26/2023 69 > OR = 50 mg/dL Final   Triglycerides  Date Value Ref Range Status  05/26/2023 47 <150 mg/dL Final         Passed - Cr in normal range and within 360 days    Creat  Date Value Ref Range Status  05/26/2023 0.81 0.60 - 0.95 mg/dL Final         Passed - Patient is not pregnant      Passed - Valid encounter within last 12 months    Recent Outpatient Visits           1 month ago Essential hypertension   [place] [facility] Pickard, Cisco Crest, MD   7 months ago Essential hypertension   [place] [facility] Pickard, Cisco Crest, MD   1 year ago Essential hypertension   [place] [facility] Austine Lefort, MD   1 year ago Essential hypertension    [facility] Pickard, Cisco Crest, MD

## 2023-07-23 ENCOUNTER — Other Ambulatory Visit: Payer: Self-pay | Admitting: Family Medicine

## 2023-07-26 ENCOUNTER — Ambulatory Visit

## 2023-07-31 ENCOUNTER — Ambulatory Visit

## 2023-08-11 ENCOUNTER — Telehealth: Payer: Self-pay

## 2023-08-11 NOTE — Telephone Encounter (Signed)
 Copied from CRM [id number]. Topic: Appointments - Scheduling Inquiry for Clinic >> Aug 11, 2023  8:31 AM Jillian Koch wrote: Reason for CRM: The patient would like to be contacted by a member of practice staff to continue discussions related to scheduling their shingles vaccine. Please contact the patient further if/when possible

## 2023-08-11 NOTE — Telephone Encounter (Signed)
 Lvm for pt to return call.

## 2023-08-14 NOTE — Telephone Encounter (Signed)
 Scheduled for 2nd shingles vaccine.

## 2023-08-21 DIAGNOSIS — L821 Other seborrheic keratosis: Secondary | ICD-10-CM | POA: Diagnosis not present

## 2023-08-21 DIAGNOSIS — Z85828 Personal history of other malignant neoplasm of skin: Secondary | ICD-10-CM | POA: Diagnosis not present

## 2023-08-25 ENCOUNTER — Ambulatory Visit: Admitting: Family Medicine

## 2023-08-25 DIAGNOSIS — Z23 Encounter for immunization: Secondary | ICD-10-CM

## 2023-08-25 NOTE — Progress Notes (Signed)
 Patient is in office today for a nurse visit for Shingrix  # 2 Immunization. Patient Injection was given in the  Right deltoid. Patient tolerated injection well.

## 2023-08-29 ENCOUNTER — Other Ambulatory Visit: Payer: Self-pay

## 2023-08-29 ENCOUNTER — Telehealth: Payer: Self-pay | Admitting: Family Medicine

## 2023-08-29 DIAGNOSIS — M810 Age-related osteoporosis without current pathological fracture: Secondary | ICD-10-CM

## 2023-08-29 MED ORDER — ALENDRONATE SODIUM 70 MG PO TABS: ORAL_TABLET | ORAL | 0 refills | Status: DC

## 2023-08-29 NOTE — Telephone Encounter (Signed)
 Prescription Request  08/29/2023  LOV: 08/25/2023  What is the name of the medication or equipment?   alendronate  (FOSAMAX ) 70 MG tablet   Have you contacted your pharmacy to request a refill? Yes   Which pharmacy would you like this sent to?  [facility] [address], [place] - [address] [place] [address] Phone: [phone] Fax: [phone]    Patient notified that their request is being sent to the clinical staff for review and that they should receive a response within 2 business days.   Please advise pharmacist.

## 2023-10-19 ENCOUNTER — Other Ambulatory Visit: Payer: Self-pay | Admitting: Family Medicine

## 2023-10-19 DIAGNOSIS — I10 Essential (primary) hypertension: Secondary | ICD-10-CM

## 2023-10-20 DIAGNOSIS — H401331 Pigmentary glaucoma, bilateral, mild stage: Secondary | ICD-10-CM | POA: Diagnosis not present

## 2023-10-20 DIAGNOSIS — H26493 Other secondary cataract, bilateral: Secondary | ICD-10-CM | POA: Diagnosis not present

## 2023-10-20 DIAGNOSIS — H04123 Dry eye syndrome of bilateral lacrimal glands: Secondary | ICD-10-CM | POA: Diagnosis not present

## 2023-10-20 DIAGNOSIS — H43813 Vitreous degeneration, bilateral: Secondary | ICD-10-CM | POA: Diagnosis not present

## 2023-11-06 ENCOUNTER — Other Ambulatory Visit: Payer: Self-pay | Admitting: Family Medicine

## 2023-11-07 DIAGNOSIS — H26491 Other secondary cataract, right eye: Secondary | ICD-10-CM | POA: Diagnosis not present

## 2023-11-21 DIAGNOSIS — H26492 Other secondary cataract, left eye: Secondary | ICD-10-CM | POA: Diagnosis not present

## 2023-12-14 ENCOUNTER — Ambulatory Visit (INDEPENDENT_AMBULATORY_CARE_PROVIDER_SITE_OTHER): Payer: Medicare HMO | Admitting: *Deleted

## 2023-12-14 VITALS — Ht 63.0 in | Wt 143.0 lb

## 2023-12-14 DIAGNOSIS — Z0000 Encounter for general adult medical examination without abnormal findings: Secondary | ICD-10-CM | POA: Diagnosis not present

## 2023-12-14 NOTE — Progress Notes (Signed)
 Subjective:   Jillian Koch is a 83 y.o. female who presents for Medicare Annual (Subsequent) preventive examination.  Visit Complete: Virtual I connected with  Cathlean JULIANNA Lunger on 12/14/23 by a audio enabled telemedicine application and verified that I am speaking with the correct person using two identifiers.  Patient Location: Home  Provider Location: Home Office  I discussed the limitations of evaluation and management by telemedicine. The patient expressed understanding and agreed to proceed.  Vital Signs: Because this visit was a virtual/telehealth visit, some criteria may be missing or patient reported. Any vitals not documented were not able to be obtained and vitals that have been documented are patient reported.    Cardiac Risk Factors include: advanced age (>34men, >10 women);hypertension;obesity (BMI >30kg/m2)     Objective:    Today's Vitals   12/14/23 1220  Weight: 143 lb (64.9 kg)  Height: 5' 3 (1.6 m)   Body mass index is 25.33 kg/m.     12/14/2023   12:18 PM 12/08/2022    1:56 PM 07/16/2021   11:23 AM 07/10/2020   11:11 AM 02/05/2018    4:24 PM  Advanced Directives  Does Patient Have a Medical Advance Directive? No No No No Yes   Type of Agricultural Consultant;Living will  Does patient want to make changes to medical advance directive?     No - Patient declined   Copy of Healthcare Power of Attorney in Chart?     No - copy requested   Would patient like information on creating a medical advance directive? No - Patient declined Yes (MAU/Ambulatory/Procedural Areas - Information given) No - Patient declined No - Patient declined No - Patient declined      Data saved with a previous flowsheet row definition    Current Medications (verified) Outpatient Encounter Medications as of 12/14/2023  Medication Sig   alendronate  (FOSAMAX ) 70 MG tablet TAKE 1 TABLET BY MOUTH ONCE A WEEK. TAKE WITH A FULL GLASS OF WATER ON AN EMPTY  STOMACH. SIT UPRIGHT FOR 30 MINUTES AFTER TAKING.   amLODipine  (NORVASC ) 10 MG tablet Take 1 tablet by mouth once daily   COD LIVER OIL PO Take by mouth.   Ginkgo Biloba (GNP GINGKO BILOBA EXTRACT PO) Take by mouth.   hydrochlorothiazide  (HYDRODIURIL ) 25 MG tablet Take 1 tablet by mouth once daily   latanoprost (XALATAN) 0.005 % ophthalmic solution 1 drop at bedtime.   losartan  (COZAAR ) 50 MG tablet Take 1 tablet (50 mg total) by mouth daily.   Multiple Vitamins-Minerals (PRESERVISION AREDS PO) Take by mouth.   rosuvastatin  (CRESTOR ) 10 MG tablet Take 1 tablet by mouth once daily   vitamin C (ASCORBIC ACID) 500 MG tablet Take 500 mg by mouth daily.   zinc gluconate 50 MG tablet Take 50 mg by mouth daily.   fish oil-omega-3 fatty acids 1000 MG capsule Take 1,200 mg by mouth daily. (Patient not taking: Reported on 12/14/2023)   No facility-administered encounter medications on file as of 12/14/2023.    Allergies (verified) Penicillins   History: Past Medical History:  Diagnosis Date   Hypertension    Past Surgical History:  Procedure Laterality Date   ABDOMINAL HYSTERECTOMY     APPENDECTOMY     Family History  Problem Relation Age of Onset   Stroke Mother    Breast cancer Sister    Colon cancer Maternal Aunt    Social History   Socioeconomic History   Marital status:  Married    Spouse name: Beryl   Number of children: 1   Years of education: Not on file   Highest education level: Not on file  Occupational History   Occupation: Retired-Cleans houses  Tobacco Use   Smoking status: Never   Smokeless tobacco: Never  Vaping Use   Vaping status: Never Used  Substance and Sexual Activity   Alcohol use: Never   Drug use: Never   Sexual activity: Yes  Other Topics Concern   Not on file  Social History Narrative   Married x 56 years 07/2021.   1 adopted son.   Social Drivers of Corporate Investment Banker Strain: Low Risk  (12/14/2023)   Overall Financial Resource  Strain (CARDIA)    Difficulty of Paying Living Expenses: Not hard at all  Food Insecurity: No Food Insecurity (12/14/2023)   Hunger Vital Sign    Worried About Running Out of Food in the Last Year: Never true    Ran Out of Food in the Last Year: Never true  Transportation Needs: No Transportation Needs (12/14/2023)   PRAPARE - Administrator, Civil Service (Medical): No    Lack of Transportation (Non-Medical): No  Physical Activity: Sufficiently Active (12/14/2023)   Exercise Vital Sign    Days of Exercise per Week: 4 days    Minutes of Exercise per Session: 40 min  Stress: No Stress Concern Present (12/14/2023)   [employer] of Occupational Health - Occupational Stress Questionnaire    Feeling of Stress: Not at all  Social Connections: Moderately Integrated (12/14/2023)   Social Connection and Isolation Panel    Frequency of Communication with Friends and Family: Twice a week    Frequency of Social Gatherings with Friends and Family: More than three times a week    Attends Religious Services: More than 4 times per year    Active Member of [employer] or Organizations: No    Attends Engineer, Structural: Never    Marital Status: Married    Tobacco Counseling Counseling given: Not Answered   Clinical Intake:  Pre-visit preparation completed: Yes  Pain : No/denies pain     Diabetes: No  How often do you need to have someone help you when you read instructions, pamphlets, or other written materials from your doctor or pharmacy?: 1 - Never  Interpreter Needed?: No  Information entered by :: Mliss Graff LPN   Activities of Daily Living    12/14/2023   12:21 PM  In your present state of health, do you have any difficulty performing the following activities:  Hearing? 0  Vision? 0  Difficulty concentrating or making decisions? 0  Walking or climbing stairs? 0  Dressing or bathing? 0  Doing errands, shopping? 0  Preparing Food and eating ? N   Using the Toilet? N  In the past six months, have you accidently leaked urine? N  Do you have problems with loss of bowel control? N  Managing your Medications? N  Managing your Finances? N  Housekeeping or managing your Housekeeping? N    Patient Care Team: Duanne Butler DASEN, MD as PCP - General (Family Medicine) Verlin Lonni BIRCH, MD as PCP - Cardiology (Cardiology) Patrcia Sharper, MD as Consulting Physician (Ophthalmology) Joshua Blamer, MD as Attending Physician (Dermatology)  Indicate any recent Medical Services you may have received from other than [facility] providers in the past year (date may be approximate).     Assessment:   This is a routine wellness  examination for Ahmira.  Hearing/Vision screen Hearing Screening - Comments:: No trouble hearing Vision Screening - Comments:: [facility] Up to  date   Goals Addressed   None    Depression Screen    12/14/2023   12:18 PM 12/08/2022    1:49 PM 07/16/2021   11:19 AM 07/10/2020   11:13 AM 02/18/2020    2:32 PM 05/03/2018    8:01 AM 11/02/2017    8:00 AM  PHQ 2/9 Scores  PHQ - 2 Score 0 0 0 0 0 0 0  PHQ- 9 Score 0          Fall Risk    12/14/2023   12:20 PM 12/08/2022    1:55 PM 07/16/2021   11:23 AM 07/10/2020   11:12 AM 02/18/2020    2:32 PM  Fall Risk   Falls in the past year? 0 0 0 0 0  Number falls in past yr: 0 0 0 0 0  Injury with Fall? 0 0 0 0 0  Risk for fall due to :  No Fall Risks No Fall Risks No Fall Risks   Follow up Falls evaluation completed;Education provided;Falls prevention discussed Falls prevention discussed;Education provided;Falls evaluation completed Falls prevention discussed  Falls evaluation completed;Falls prevention discussed       Data saved with a previous flowsheet row definition    MEDICARE RISK AT HOME: Medicare Risk at Home Any stairs in or around the home?: Yes If so, are there any without handrails?: No Home free of loose throw rugs in walkways, pet beds, electrical cords,  etc?: Yes Adequate lighting in your home to reduce risk of falls?: Yes Life alert?: No Use of a cane, walker or w/c?: No Grab bars in the bathroom?: Yes Shower chair or bench in shower?: Yes Elevated toilet seat or a handicapped toilet?: Yes  TIMED UP AND GO:  Was the test performed?  No    Cognitive Function:        12/14/2023   12:21 PM 12/08/2022    1:56 PM 07/16/2021   11:24 AM  6CIT Screen  What Year?  0 points 0 points  What month?  0 points 0 points  What time? 0 points 0 points 0 points  Count back from 20 0 points 0 points 0 points  Months in reverse 0 points 0 points 0 points  Repeat phrase 0 points 0 points 0 points  Total Score  0 points 0 points    Immunizations Immunization History  Administered Date(s) Administered   DTaP 08/27/2010   Influenza-Unspecified 12/02/2011   Moderna Sars-Covid-2 Vaccination 04/10/2019, 05/08/2019   Pneumococcal Conjugate-13 05/03/2018   Pneumococcal Polysaccharide-23 12/18/2006, 12/02/2011   Zoster Recombinant(Shingrix ) 05/26/2023, 08/25/2023    TDAP status: Due, Education has been provided regarding the importance of this vaccine. Advised may receive this vaccine at local pharmacy or Health Dept. Aware to provide a copy of the vaccination record if obtained from local pharmacy or Health Dept. Verbalized acceptance and understanding.  Flu Vaccine status: Declined, Education has been provided regarding the importance of this vaccine but patient still declined. Advised may receive this vaccine at local pharmacy or Health Dept. Aware to provide a copy of the vaccination record if obtained from local pharmacy or Health Dept. Verbalized acceptance and understanding.  Pneumococcal vaccine status: Up to date  Covid-19 vaccine status: Declined, Education has been provided regarding the importance of this vaccine but patient still declined. Advised may receive this vaccine at local pharmacy or Health Dept.or vaccine clinic.  Aware to  provide a copy of the vaccination record if obtained from local pharmacy or Health Dept. Verbalized acceptance and understanding.  Qualifies for Shingles Vaccine? No   Zostavax completed Yes   Shingrix  Completed?: Yes  Screening Tests Health Maintenance  Topic Date Due   DTaP/Tdap/Td (2 - Tdap) 08/26/2020   Influenza Vaccine  05/14/2024 (Originally 09/15/2023)   Medicare Annual Wellness (AWV)  12/13/2024   Pneumococcal Vaccine: 50+ Years  Completed   DEXA SCAN  Completed   Zoster Vaccines- Shingrix   Completed   Meningococcal B Vaccine  Aged Out   Mammogram  Discontinued   Colonoscopy  Discontinued   COVID-19 Vaccine  Discontinued   Hepatitis C Screening  Discontinued    Health Maintenance  Health Maintenance Due  Topic Date Due   DTaP/Tdap/Td (2 - Tdap) 08/26/2020    Colorectal cancer screening: No longer required.   Mammogram status: No longer required due to  .  Bone Density status: Completed 2022. Results reflect: Bone density results: OSTEOPOROSIS. Repeat every Education provided years.  Lung Cancer Screening: (Low Dose CT Chest recommended if Age 102-80 years, 20 pack-year currently smoking OR have quit w/in 15years.)   Lung Cancer Screening Referral:   Additional Screening:  Hepatitis C Screening: does not qualify; Completed 2022  Vision Screening: Recommended annual ophthalmology exams for early detection of glaucoma and other disorders of the eye. Is the patient up to date with their annual eye exam?  Yes  Who is the provider or what is the name of the office in which the patient attends annual eye exams? [facility] If pt is not established with a provider, would they like to be referred to a provider to establish care? No .   Dental Screening: Recommended annual dental exams for proper oral hygiene    Community Resource Referral / Chronic Care Management: CRR required this visit?  No   CCM required this visit?  No     Plan:     I have personally  reviewed and noted the following in the patient's chart:   Medical and social history Use of alcohol, tobacco or illicit drugs  Current medications and supplements including opioid prescriptions. Patient is not currently taking opioid prescriptions. Functional ability and status Nutritional status Physical activity Advanced directives List of other physicians Hospitalizations, surgeries, and ER visits in previous 12 months Vitals Screenings to include cognitive, depression, and falls Referrals and appointments  In addition, I have reviewed and discussed with patient certain preventive protocols, quality metrics, and best practice recommendations. A written personalized care plan for preventive services as well as general preventive health recommendations were provided to patient.     Mliss Graff, LPN   [id number]   After Visit Summary: (MyChart) Due to this being a telephonic visit, the after visit summary with patients personalized plan was offered to patient via MyChart   Nurse Notes:

## 2023-12-14 NOTE — Patient Instructions (Signed)
 [name] , Thank you for taking time to come for your Medicare Wellness Visit. I appreciate your ongoing commitment to your health goals. Please review the following plan we discussed and let me know if I can assist you in the future.   Screening recommendations/referrals: Colonoscopy:  Mammogram:  Bone Density:  Recommended yearly ophthalmology/optometry visit for glaucoma screening and checkup Recommended yearly dental visit for hygiene and checkup  Vaccinations: Influenza vaccine:  Pneumococcal vaccine:  Tdap vaccine:  Shingles vaccine:       Preventive Care 65 Years and Older, Female Preventive care refers to lifestyle choices and visits with your health care provider that can promote health and wellness. What does preventive care include? A yearly physical exam. This is also called an annual well check. Dental exams once or twice a year. Routine eye exams. Ask your health care provider how often you should have your eyes checked. Personal lifestyle choices, including: Daily care of your teeth and gums. Regular physical activity. Eating a healthy diet. Avoiding tobacco and drug use. Limiting alcohol use. Practicing safe sex. Taking low-dose aspirin every day. Taking vitamin and mineral supplements as recommended by your health care provider. What happens during an annual well check? The services and screenings done by your health care provider during your annual well check will depend on your age, overall health, lifestyle risk factors, and family history of disease. Counseling  Your health care provider may ask you questions about your: Alcohol use. Tobacco use. Drug use. Emotional well-being. Home and relationship well-being. Sexual activity. Eating habits. History of falls. Memory and ability to understand (cognition). Work and work astronomer. Reproductive health. Screening  You may have the following tests or measurements: Height, weight, and BMI. Blood  pressure. Lipid and cholesterol levels. These may be checked every 5 years, or more frequently if you are over 66 years old. Skin check. Lung cancer screening. You may have this screening every year starting at age 15 if you have a 30-pack-year history of smoking and currently smoke or have quit within the past 15 years. Fecal occult blood test (FOBT) of the stool. You may have this test every year starting at age 51. Flexible sigmoidoscopy or colonoscopy. You may have a sigmoidoscopy every 5 years or a colonoscopy every 10 years starting at age 67. Hepatitis C blood test. Hepatitis B blood test. Sexually transmitted disease (STD) testing. Diabetes screening. This is done by checking your blood sugar (glucose) after you have not eaten for a while (fasting). You may have this done every 1-3 years. Bone density scan. This is done to screen for osteoporosis. You may have this done starting at age 23. Mammogram. This may be done every 1-2 years. Talk to your health care provider about how often you should have regular mammograms. Talk with your health care provider about your test results, treatment options, and if necessary, the need for more tests. Vaccines  Your health care provider may recommend certain vaccines, such as: Influenza vaccine. This is recommended every year. Tetanus, diphtheria, and acellular pertussis (Tdap, Td) vaccine. You may need a Td booster every 10 years. Zoster vaccine. You may need this after age 46. Pneumococcal 13-valent conjugate (PCV13) vaccine. One dose is recommended after age 17. Pneumococcal polysaccharide (PPSV23) vaccine. One dose is recommended after age 36. Talk to your health care provider about which screenings and vaccines you need and how often you need them. This information is not intended to replace advice given to you by your health care  provider. Make sure you discuss any questions you have with your health care provider. Document Released:  02/27/2015 Document Revised: 10/21/2015 Document Reviewed: 12/02/2014 Elsevier Interactive Patient Education  2017 [employer].  Fall Prevention in the Home Falls can cause injuries. They can happen to people of all ages. There are many things you can do to make your home safe and to help prevent falls. What can I do on the outside of my home? Regularly fix the edges of walkways and driveways and fix any cracks. Remove anything that might make you trip as you walk through a door, such as a raised step or threshold. Trim any bushes or trees on the path to your home. Use bright outdoor lighting. Clear any walking paths of anything that might make someone trip, such as rocks or tools. Regularly check to see if handrails are loose or broken. Make sure that both sides of any steps have handrails. Any raised decks and porches should have guardrails on the edges. Have any leaves, snow, or ice cleared regularly. Use sand or salt on walking paths during winter. Clean up any spills in your garage right away. This includes oil or grease spills. What can I do in the bathroom? Use night lights. Install grab bars by the toilet and in the tub and shower. Do not use towel bars as grab bars. Use non-skid mats or decals in the tub or shower. If you need to sit down in the shower, use a plastic, non-slip stool. Keep the floor dry. Clean up any water that spills on the floor as soon as it happens. Remove soap buildup in the tub or shower regularly. Attach bath mats securely with double-sided non-slip rug tape. Do not have throw rugs and other things on the floor that can make you trip. What can I do in the bedroom? Use night lights. Make sure that you have a light by your bed that is easy to reach. Do not use any sheets or blankets that are too big for your bed. They should not hang down onto the floor. Have a firm chair that has side arms. You can use this for support while you get dressed. Do not have  throw rugs and other things on the floor that can make you trip. What can I do in the kitchen? Clean up any spills right away. Avoid walking on wet floors. Keep items that you use a lot in easy-to-reach places. If you need to reach something above you, use a strong step stool that has a grab bar. Keep electrical cords out of the way. Do not use floor polish or wax that makes floors slippery. If you must use wax, use non-skid floor wax. Do not have throw rugs and other things on the floor that can make you trip. What can I do with my stairs? Do not leave any items on the stairs. Make sure that there are handrails on both sides of the stairs and use them. Fix handrails that are broken or loose. Make sure that handrails are as long as the stairways. Check any carpeting to make sure that it is firmly attached to the stairs. Fix any carpet that is loose or worn. Avoid having throw rugs at the top or bottom of the stairs. If you do have throw rugs, attach them to the floor with carpet tape. Make sure that you have a light switch at the top of the stairs and the bottom of the stairs. If you do not have  them, ask someone to add them for you. What else can I do to help prevent falls? Wear shoes that: Do not have high heels. Have rubber bottoms. Are comfortable and fit you well. Are closed at the toe. Do not wear sandals. If you use a stepladder: Make sure that it is fully opened. Do not climb a closed stepladder. Make sure that both sides of the stepladder are locked into place. Ask someone to hold it for you, if possible. Clearly mark and make sure that you can see: Any grab bars or handrails. First and last steps. Where the edge of each step is. Use tools that help you move around (mobility aids) if they are needed. These include: Canes. Walkers. Scooters. Crutches. Turn on the lights when you go into a dark area. Replace any light bulbs as soon as they burn out. Set up your furniture so  you have a clear path. Avoid moving your furniture around. If any of your floors are uneven, fix them. If there are any pets around you, be aware of where they are. Review your medicines with your doctor. Some medicines can make you feel dizzy. This can increase your chance of falling. Ask your doctor what other things that you can do to help prevent falls. This information is not intended to replace advice given to you by your health care provider. Make sure you discuss any questions you have with your health care provider. Document Released: 11/27/2008 Document Revised: 07/09/2015 Document Reviewed: 03/07/2014 Elsevier Interactive Patient Education  2017 [employer].

## 2023-12-19 ENCOUNTER — Telehealth: Payer: Self-pay

## 2023-12-19 ENCOUNTER — Other Ambulatory Visit: Payer: Self-pay

## 2023-12-19 DIAGNOSIS — M810 Age-related osteoporosis without current pathological fracture: Secondary | ICD-10-CM

## 2023-12-19 MED ORDER — ALENDRONATE SODIUM 70 MG PO TABS: ORAL_TABLET | ORAL | 0 refills | Status: AC

## 2023-12-19 NOTE — Telephone Encounter (Signed)
 Sent in medication

## 2023-12-19 NOTE — Telephone Encounter (Signed)
 Prescription Request  12/19/2023  LOV: 05/26/23  What is the name of the medication or equipment? alendronate  (FOSAMAX ) 70 MG tablet [[id number]]   Have you contacted your pharmacy to request a refill? Yes   Which pharmacy would you like this sent to?  [facility] [address] [place] - [address] [place] [address] Phone: [phone] Fax: [phone]    Patient notified that their request is being sent to the clinical staff for review and that they should receive a response within 2 business days.   Please advise at [facility] [phone]

## 2024-01-18 ENCOUNTER — Other Ambulatory Visit: Payer: Self-pay | Admitting: Family Medicine

## 2024-01-20 NOTE — Telephone Encounter (Signed)
 Requested medications are due for refill today.  yes  Requested medications are on the active medications list.  yes  Last refill. 11/18/2022 #90 3 rf  Future visit scheduled.   no  Notes to clinic.  Expired labs.    Requested Prescriptions  Pending Prescriptions Disp Refills   losartan  (COZAAR ) 50 MG tablet [Pharmacy Med Name: Losartan  Potassium 50 MG Oral Tablet] 90 tablet 0    Sig: Take 1 tablet by mouth once daily     Cardiovascular:  Angiotensin Receptor Blockers Failed - 01/20/2024  9:18 PM      Failed - Cr in normal range and within 180 days    Creat  Date Value Ref Range Status  05/26/2023 0.81 0.60 - 0.95 mg/dL Final         Failed - K in normal range and within 180 days    Potassium  Date Value Ref Range Status  05/26/2023 4.0 3.5 - 5.3 mmol/L Final         Passed - Patient is not pregnant      Passed - Last BP in normal range    BP Readings from Last 1 Encounters:  05/26/23 112/60         Passed - Valid encounter within last 6 months    Recent Outpatient Visits           4 months ago Encounter for immunization   [facility] Duanne Butler DASEN, MD   7 months ago Essential hypertension   [facility] Duanne, Butler DASEN, MD   1 year ago Essential hypertension   [place] [facility] Duanne Butler DASEN, MD   1 year ago Essential hypertension   [place] [facility] Duanne Butler DASEN, MD   2 years ago Essential hypertension   [place] [facility] Pickard, Butler DASEN, MD

## 2024-01-24 ENCOUNTER — Ambulatory Visit: Payer: Self-pay

## 2024-01-24 NOTE — Telephone Encounter (Signed)
 FYI Only or Action Required?: FYI only for provider: appointment scheduled on 12/11.  Patient was last seen in primary care on 08/25/2023 by Duanne Butler DASEN, MD.  Called Nurse Triage reporting URI.  Symptoms began a week ago.  Interventions attempted: OTC medications: Mucinex.  Symptoms are: gradually worsening.  Triage Disposition: See PCP When Office is Open (Within 3 Days)  Patient/caregiver understands and will follow disposition?: Yes  Copied from CRM #[id number]. Topic: Clinical - Red Word Triage >> Jan 24, 2024 11:33 AM Donna BRAVO wrote: Red Word that prompted transfer to Nurse Triage:  -head cold -taking Mucinex every 12hrs -no fever -cough -chest feels tight -breathing okay Reason for Disposition  [1] Sinus congestion (pressure, fullness) AND [2] present > 10 days  Answer Assessment - Initial Assessment Questions URI symptoms for about a week. Feels like it is starting to settle into her chest. Sinus congestion, cough, mild sore throat, chest congestion.  Denies SOB, Chest pain, fever or dizziness Appt with PCP tomorrow to assess. ED?UC precautions understood.   1. LOCATION: Where does it hurt?      Pressure Behind her eyes 2. ONSET: When did the sinus pain start?  (e.g., hours, days)      A week  3. SEVERITY: How bad is the pain?   (Scale 0-10; or none, mild, moderate or severe)     Denies pain 4. RECURRENT SYMPTOM: Have you ever had sinus problems before? If Yes, ask: When was the last time? and What happened that time?      seasonal 5. NASAL CONGESTION: Is the nose blocked? If Yes, ask: Can you open it or must you breathe through your mouth?     Both nostrils stuffed up 6. NASAL DISCHARGE: Do you have discharge from your nose? If so ask, What color?     greenish 7. FEVER: Do you have a fever? If Yes, ask: What is it, how was it measured, and when did it start?      denies 8. OTHER SYMPTOMS: Do you have any other symptoms? (e.g., sore  throat, cough, earache, difficulty breathing)     Chest congestion, cough, mild sore throat  Protocols used: Sinus Pain or Congestion-A-AH

## 2024-01-25 ENCOUNTER — Encounter: Payer: Self-pay | Admitting: Family Medicine

## 2024-01-25 ENCOUNTER — Ambulatory Visit: Admitting: Family Medicine

## 2024-01-25 VITALS — BP 142/84 | HR 74 | Temp 98.4°F | Ht 63.0 in | Wt 139.8 lb

## 2024-01-25 DIAGNOSIS — J069 Acute upper respiratory infection, unspecified: Secondary | ICD-10-CM

## 2024-01-25 MED ORDER — BENZONATATE 200 MG PO CAPS: 200.0000 mg | ORAL_CAPSULE | Freq: Three times a day (TID) | ORAL | 0 refills | Status: AC | PRN

## 2024-01-25 NOTE — Progress Notes (Signed)
 Subjective:    Patient ID: Jillian Koch, female    DOB: [birth year], 83 y.o.   MRN: [id number]  Symptoms began about 9 days ago.  Symptoms include runny nose and head congestion.  Symptoms include a cough occasionally productive of sputum.  She denies any shortness of breath.  She denies any fever or chills.  She denies any chest pain or pleurisy.  She denies any body aches.  She denies any nausea or vomiting.  She denies any otalgia. Past Medical History:  Diagnosis Date   Hypertension    Past Surgical History:  Procedure Laterality Date   ABDOMINAL HYSTERECTOMY     APPENDECTOMY     Current Outpatient Medications on File Prior to Visit  Medication Sig Dispense Refill   alendronate  (FOSAMAX ) 70 MG tablet TAKE 1 TABLET BY MOUTH ONCE A WEEK. TAKE WITH A FULL GLASS OF WATER ON AN EMPTY STOMACH. SIT UPRIGHT FOR 30 MINUTES AFTER TAKING. 12 tablet 0   amLODipine  (NORVASC ) 10 MG tablet Take 1 tablet by mouth once daily 90 tablet 0   COD LIVER OIL PO Take by mouth.     fish oil-omega-3 fatty acids 1000 MG capsule Take 1,200 mg by mouth daily. (Patient not taking: Reported on 12/14/2023)     Ginkgo Biloba (GNP GINGKO BILOBA EXTRACT PO) Take by mouth.     hydrochlorothiazide  (HYDRODIURIL ) 25 MG tablet Take 1 tablet by mouth once daily 90 tablet 0   latanoprost (XALATAN) 0.005 % ophthalmic solution 1 drop at bedtime.     losartan  (COZAAR ) 50 MG tablet Take 1 tablet by mouth once daily 90 tablet 0   Multiple Vitamins-Minerals (PRESERVISION AREDS PO) Take by mouth.     rosuvastatin  (CRESTOR ) 10 MG tablet Take 1 tablet by mouth once daily 90 tablet 3   vitamin C (ASCORBIC ACID) 500 MG tablet Take 500 mg by mouth daily.     zinc gluconate 50 MG tablet Take 50 mg by mouth daily.     No current facility-administered medications on file prior to visit.   Allergies  Allergen Reactions   Penicillins Rash   Social History   Socioeconomic History   Marital status: Married    Spouse name: Beryl    Number of children: 1   Years of education: Not on file   Highest education level: Not on file  Occupational History   Occupation: Retired-Cleans houses  Tobacco Use   Smoking status: Never   Smokeless tobacco: Never  Vaping Use   Vaping status: Never Used  Substance and Sexual Activity   Alcohol use: Never   Drug use: Never   Sexual activity: Yes  Other Topics Concern   Not on file  Social History Narrative   Married x 56 years 07/2021.   1 adopted son.   Social Drivers of Health   Tobacco Use: Low Risk (12/14/2023)   Patient History    Smoking Tobacco Use: Never    Smokeless Tobacco Use: Never    Passive Exposure: Not on file  Financial Resource Strain: Low Risk (12/14/2023)   Overall Financial Resource Strain (CARDIA)    Difficulty of Paying Living Expenses: Not hard at all  Food Insecurity: No Food Insecurity (12/14/2023)   Epic    Worried About Programme Researcher, Broadcasting/film/video in the Last Year: Never true    Ran Out of Food in the Last Year: Never true  Transportation Needs: No Transportation Needs (12/14/2023)   Epic    Lack of Transportation (Medical): No  Lack of Transportation (Non-Medical): No  Physical Activity: Sufficiently Active (12/14/2023)   Exercise Vital Sign    Days of Exercise per Week: 4 days    Minutes of Exercise per Session: 40 min  Stress: No Stress Concern Present (12/14/2023)   [employer] of Occupational Health - Occupational Stress Questionnaire    Feeling of Stress: Not at all  Social Connections: Moderately Integrated (12/14/2023)   Social Connection and Isolation Panel    Frequency of Communication with Friends and Family: Twice a week    Frequency of Social Gatherings with Friends and Family: More than three times a week    Attends Religious Services: More than 4 times per year    Active Member of [employer] or Organizations: No    Attends Banker Meetings: Never    Marital Status: Married  Catering Manager Violence: Not At  Risk (12/14/2023)   Epic    Fear of Current or Ex-Partner: No    Emotionally Abused: No    Physically Abused: No    Sexually Abused: No  Depression (PHQ2-9): Low Risk (12/14/2023)   Depression (PHQ2-9)    PHQ-2 Score: 0  Alcohol Screen: Low Risk (12/14/2023)   Alcohol Screen    Last Alcohol Screening Score (AUDIT): 0  Housing: Unknown (12/14/2023)   Epic    Unable to Pay for Housing in the Last Year: No    Number of Times Moved in the Last Year: Not on file    Homeless in the Last Year: No  Utilities: Not At Risk (12/08/2022)   [facility] Utilities    Threatened with loss of utilities: No  Health Literacy: Adequate Health Literacy (12/14/2023)   B1300 Health Literacy    Frequency of need for help with medical instructions: Never    Review of Systems  All other systems reviewed and are negative.      Objective:   Physical Exam Vitals reviewed.  Constitutional:      Appearance: Normal appearance.  Cardiovascular:     Rate and Rhythm: Normal rate and regular rhythm.     Heart sounds: Murmur heard.     No friction rub. No gallop.  Pulmonary:     Effort: Pulmonary effort is normal. No respiratory distress.     Breath sounds: Normal breath sounds. No wheezing, rhonchi or rales.  Abdominal:     General: Bowel sounds are normal. There is no distension.     Palpations: Abdomen is soft.     Tenderness: There is no abdominal tenderness. There is no guarding.  Musculoskeletal:     Right lower leg: No edema.     Left lower leg: No edema.  Neurological:     Mental Status: She is alert.       Assessment & Plan:  Viral upper respiratory tract infection Exam today is reassuring.  Vital signs are reassuring.  She can use Tessalon  Perles 200 mg every 8 hours as needed for cough.  Recommended tincture of time.  Anticipate gradual improvement over the next 3 to 4 days.  If she develops high fever or purulent sputum she can start taking a Z-Pak for possible secondary bacterial infection  given her advanced age however based on her exam today I feel confident that this is a virus and that it is slowly improving

## 2024-01-30 NOTE — Progress Notes (Signed)
° °  01/30/2024  Patient ID: Jillian Koch, female   DOB: 08/20/1940, 83 y.o.   MRN: [id number]  Pharmacy Quality Measure Review  This patient is appearing on a report for being at risk of failing the adherence measure for hypertension (ACEi/ARB) medications this calendar year.   Medication: losartan  50mg  Last fill date: 12/8 for 30 day supply  Insurance report was not up to date. No action needed at this time.   Lang Sieve, PharmD, BCGP Clinical Pharmacist  [phone]

## 2024-12-19 ENCOUNTER — Ambulatory Visit
